# Patient Record
Sex: Male | Born: 1975 | Race: White | Marital: Married | Smoking: Current every day smoker
Health system: Southern US, Community
[De-identification: ages and names within clinical notes are randomized; demographics above are authoritative.]

## PROBLEM LIST (undated history)

## (undated) DIAGNOSIS — K219 Gastro-esophageal reflux disease without esophagitis: Secondary | ICD-10-CM

## (undated) DIAGNOSIS — Z8489 Family history of other specified conditions: Secondary | ICD-10-CM

## (undated) DIAGNOSIS — K2270 Barrett's esophagus without dysplasia: Secondary | ICD-10-CM

## (undated) DIAGNOSIS — K449 Diaphragmatic hernia without obstruction or gangrene: Secondary | ICD-10-CM

## (undated) HISTORY — PX: APPENDECTOMY (OPEN): SHX54

## (undated) HISTORY — DX: Barrett's esophagus without dysplasia: K22.70

## (undated) HISTORY — DX: Gastro-esophageal reflux disease without esophagitis: K21.9

## (undated) HISTORY — DX: Diaphragmatic hernia without obstruction or gangrene: K44.9

---

## 2009-03-15 ENCOUNTER — Emergency Department: Admit: 2009-03-15 | Payer: Self-pay | Source: Emergency Department | Admitting: Emergency Medicine

## 2009-03-15 LAB — COMPREHENSIVE METABOLIC PANEL
ALT: 35 U/L (ref 0–55)
AST (SGOT): 26 U/L (ref 5–34)
Albumin/Globulin Ratio: 1.4 (ref 0.9–2.2)
Albumin: 4.4 g/dL (ref 3.5–5.0)
Alkaline Phosphatase: 66 U/L (ref 40–150)
BUN: 13 mg/dL (ref 9–21)
Bilirubin, Total: 0.9 mg/dL (ref 0.2–1.2)
CO2: 23 mEq/L (ref 22–29)
Calcium: 9.3 mg/dL (ref 8.9–10.0)
Chloride: 104 mEq/L (ref 96–107)
Creatinine: 1 mg/dL (ref 0.7–1.3)
Globulin: 3.1 g/dL (ref 2.0–3.6)
Glucose: 90 mg/dL (ref 70–110)
Potassium: 4.2 mEq/L (ref 3.5–5.1)
Protein, Total: 7.5 g/dL (ref 6.0–8.3)
Sodium: 140 mEq/L (ref 136–145)

## 2009-03-15 LAB — CBC AND DIFFERENTIAL
Basophils Absolute: 0 /mm3 (ref 0.0–0.2)
Basophils: 1 % (ref 0–2)
Eosinophils Absolute: 0.2 /mm3 (ref 0.0–0.7)
Eosinophils: 3 % (ref 0–5)
Granulocytes Absolute: 4.2 /mm3 (ref 1.8–8.1)
Hematocrit: 39.5 % — ABNORMAL LOW (ref 42.0–52.0)
Hgb: 13.7 G/DL (ref 13.0–17.0)
Immature Granulocytes Absolute: 0 CUMM (ref 0.0–0.0)
Immature Granulocytes: 0 % (ref 0–1)
Lymphocytes Absolute: 1.2 /mm3 (ref 0.5–4.4)
Lymphocytes: 18 % (ref 15–41)
MCH: 29.5 PG (ref 28.0–32.0)
MCHC: 34.7 G/DL (ref 32.0–36.0)
MCV: 85.1 FL (ref 80.0–100.0)
MPV: 11.4 FL (ref 9.4–12.3)
Monocytes Absolute: 0.9 /mm3 (ref 0.0–1.2)
Monocytes: 14 % — ABNORMAL HIGH (ref 0–11)
Neutrophils %: 64 % (ref 52–75)
Platelets: 194 /mm3 (ref 140–400)
RBC: 4.64 /mm3 — ABNORMAL LOW (ref 4.70–6.00)
RDW: 13.3 % (ref 11.5–15.0)
WBC: 6.46 /mm3 (ref 3.50–10.80)

## 2009-03-15 LAB — GFR

## 2009-03-15 LAB — CREATINE KINASE W/O REFLEX (SOFT): Creatine Kinase (CK): 89 U/L (ref 30–200)

## 2009-03-15 LAB — HEMOLYSIS INDEX: Hemolysis Index: 24 Units

## 2009-03-15 LAB — D-DIMER (DVT) CERNER: D-Dimer (DVT): <100 ng mL (ref 0–600)

## 2009-03-15 LAB — TROPONIN I: Troponin I: 0.01 ng/mL

## 2009-07-22 HISTORY — PX: APPENDECTOMY: SHX54

## 2009-07-31 ENCOUNTER — Inpatient Hospital Stay
Admission: EM | Admit: 2009-07-31 | Disposition: A | Discharge status: Home / Self Care | Payer: Self-pay | Source: Emergency Department | Admitting: Surgery

## 2009-07-31 LAB — BASIC METABOLIC PANEL
BUN: 6 mg/dL — ABNORMAL LOW (ref 8–20)
CO2: 28 mEq/L (ref 21–30)
Calcium: 10.1 mg/dL (ref 8.6–10.2)
Chloride: 96 mEq/L — ABNORMAL LOW (ref 98–107)
Creatinine: 1 mg/dL (ref 0.6–1.5)
Glucose: 106 mg/dL — ABNORMAL HIGH (ref 70–100)
Potassium: 4 mEq/L (ref 3.6–5.0)
Sodium: 136 mEq/L (ref 136–146)

## 2009-07-31 LAB — HEPATIC FUNCTION PANEL
ALT: 40 U/L — ABNORMAL HIGH (ref 3–36)
AST (SGOT): 38 U/L (ref 10–41)
Albumin/Globulin Ratio: 1.6 (ref 1.1–1.8)
Albumin: 5.1 g/dL — ABNORMAL HIGH (ref 3.4–4.9)
Alkaline Phosphatase: 100 U/L (ref 43–112)
Bilirubin Direct: 0 mg/dL (ref 0.0–0.3)
Bilirubin Indirect: 1.1 mg/dL — ABNORMAL HIGH (ref 0.1–0.9)
Bilirubin, Total: 1.1 mg/dL — ABNORMAL HIGH (ref 0.1–1.0)
Globulin: 3.1 g/dL (ref 2.0–3.7)
Protein, Total: 8.2 g/dL — ABNORMAL HIGH (ref 6.0–8.0)

## 2009-07-31 LAB — CBC AND DIFFERENTIAL
Basophils Absolute: 0 /mm3 (ref 0.0–0.2)
Basophils: 0 % (ref 0–2)
Eosinophils Absolute: 0 /mm3 (ref 0.0–0.7)
Eosinophils: 0 % (ref 0–5)
Granulocytes Absolute: 12.2 /mm3 — ABNORMAL HIGH (ref 1.8–8.1)
Hematocrit: 42.7 % (ref 42.0–52.0)
Hgb: 14.4 G/DL (ref 13.0–17.0)
Immature Granulocytes Absolute: 0
Immature Granulocytes: 0 %
Lymphocytes Absolute: 0.8 /mm3 (ref 0.5–4.4)
Lymphocytes: 5 % — ABNORMAL LOW (ref 15–41)
MCH: 29.1 PG (ref 28.0–32.0)
MCHC: 33.7 G/DL (ref 32.0–36.0)
MCV: 86.3 FL (ref 80.0–100.0)
MPV: 12 FL (ref 9.4–12.3)
Monocytes Absolute: 1.3 /mm3 — ABNORMAL HIGH (ref 0.0–1.2)
Monocytes: 9 % (ref 0–11)
Neutrophils %: 85 % — ABNORMAL HIGH (ref 52–75)
Platelets: 220 /mm3 (ref 140–400)
RBC: 4.95 /mm3 (ref 4.70–6.00)
RDW: 13.6 % (ref 11.5–15.0)
WBC: 14.4 /mm3 — ABNORMAL HIGH (ref 3.50–10.80)

## 2009-07-31 LAB — AMYLASE: Amylase: 57 U/L (ref 5–90)

## 2009-07-31 LAB — GFR

## 2009-07-31 LAB — LIPASE: Lipase: 27 U/L — ABNORMAL LOW (ref 32–219)

## 2009-08-03 ENCOUNTER — Inpatient Hospital Stay
Admission: EM | Admit: 2009-08-03 | Disposition: A | Discharge status: Home / Self Care | Payer: Self-pay | Source: Emergency Department | Admitting: Surgery

## 2009-08-03 LAB — CBC AND DIFFERENTIAL
Basophils Absolute: 0 /mm3 (ref 0.0–0.2)
Basophils: 0 % (ref 0–2)
Eosinophils Absolute: 0 /mm3 (ref 0.0–0.7)
Eosinophils: 0 % (ref 0–5)
Granulocytes Absolute: 14.1 /mm3 — ABNORMAL HIGH (ref 1.8–8.1)
Hematocrit: 39.5 % — ABNORMAL LOW (ref 42.0–52.0)
Hgb: 13.2 G/DL (ref 13.0–17.0)
Immature Granulocytes Absolute: 0
Immature Granulocytes: 0 %
Lymphocytes Absolute: 0.4 /mm3 — ABNORMAL LOW (ref 0.5–4.4)
Lymphocytes: 2 % — ABNORMAL LOW (ref 15–41)
MCH: 28.8 PG (ref 28.0–32.0)
MCHC: 33.4 G/DL (ref 32.0–36.0)
MCV: 86.2 FL (ref 80.0–100.0)
MPV: 11.4 FL (ref 9.4–12.3)
Monocytes Absolute: 1.1 /mm3 (ref 0.0–1.2)
Monocytes: 7 % (ref 0–11)
Neutrophils %: 90 % — ABNORMAL HIGH (ref 52–75)
Platelets: 219 /mm3 (ref 140–400)
RBC: 4.58 /mm3 — ABNORMAL LOW (ref 4.70–6.00)
RDW: 13.6 % (ref 11.5–15.0)
WBC: 15.65 /mm3 — ABNORMAL HIGH (ref 3.50–10.80)

## 2009-08-03 LAB — COMPREHENSIVE METABOLIC PANEL
ALT: 29 U/L (ref 3–36)
AST (SGOT): 23 U/L (ref 10–41)
Albumin/Globulin Ratio: 1.4 (ref 1.1–1.8)
Albumin: 3.8 g/dL (ref 3.4–4.9)
Alkaline Phosphatase: 111 U/L (ref 43–112)
BUN: 8 mg/dL (ref 8–20)
Bilirubin, Total: 0.7 mg/dL (ref 0.1–1.0)
CO2: 30 mEq/L (ref 21–30)
Calcium: 9.6 mg/dL (ref 8.6–10.2)
Chloride: 96 mEq/L — ABNORMAL LOW (ref 98–107)
Creatinine: 1.1 mg/dL (ref 0.6–1.5)
Globulin: 2.7 g/dL (ref 2.0–3.7)
Glucose: 108 mg/dL — ABNORMAL HIGH (ref 70–100)
Potassium: 4.1 mEq/L (ref 3.6–5.0)
Protein, Total: 6.5 g/dL (ref 6.0–8.0)
Sodium: 138 mEq/L (ref 136–146)

## 2009-08-03 LAB — CBC
Hematocrit: 36.6 % — ABNORMAL LOW (ref 42.0–52.0)
Hematocrit: 34.2 % — ABNORMAL LOW (ref 42.0–52.0)
Hgb: 12.3 G/DL — ABNORMAL LOW (ref 13.0–17.0)
Hgb: 11.4 G/DL — ABNORMAL LOW (ref 13.0–17.0)
MCH: 29 PG (ref 28.0–32.0)
MCH: 28.6 PG (ref 28.0–32.0)
MCHC: 33.6 G/DL (ref 32.0–36.0)
MCHC: 33.3 G/DL (ref 32.0–36.0)
MCV: 86.3 FL (ref 80.0–100.0)
MCV: 85.9 FL (ref 80.0–100.0)
MPV: 11.1 FL (ref 9.4–12.3)
MPV: 10.4 FL (ref 9.4–12.3)
Platelets: 223 /mm3 (ref 140–400)
Platelets: 223 /mm3 (ref 140–400)
RBC: 4.24 /mm3 — ABNORMAL LOW (ref 4.70–6.00)
RBC: 3.98 /mm3 — ABNORMAL LOW (ref 4.70–6.00)
RDW: 13.7 % (ref 11.5–15.0)
RDW: 13.9 % (ref 11.5–15.0)
WBC: 15.41 /mm3 — ABNORMAL HIGH (ref 3.50–10.80)
WBC: 11.57 /mm3 — ABNORMAL HIGH (ref 3.50–10.80)

## 2009-08-03 LAB — BASIC METABOLIC PANEL
BUN: 9 mg/dL (ref 8–20)
CO2: 28 mEq/L (ref 21–30)
Calcium: 8.7 mg/dL (ref 8.6–10.2)
Chloride: 98 mEq/L (ref 98–107)
Creatinine: 1 mg/dL (ref 0.6–1.5)
Glucose: 109 mg/dL — ABNORMAL HIGH (ref 70–100)
Potassium: 4 mEq/L (ref 3.6–5.0)
Sodium: 138 mEq/L (ref 136–146)

## 2009-08-03 LAB — GFR

## 2009-08-04 LAB — CBC
Hematocrit: 35.3 % — ABNORMAL LOW (ref 42.0–52.0)
Hgb: 11.7 G/DL — ABNORMAL LOW (ref 13.0–17.0)
MCH: 28.7 PG (ref 28.0–32.0)
MCHC: 33.1 G/DL (ref 32.0–36.0)
MCV: 86.5 FL (ref 80.0–100.0)
MPV: 10.5 FL (ref 9.4–12.3)
Platelets: 237 /mm3 (ref 140–400)
RBC: 4.08 /mm3 — ABNORMAL LOW (ref 4.70–6.00)
RDW: 14 % (ref 11.5–15.0)
WBC: 9.64 /mm3 (ref 3.50–10.80)

## 2009-08-06 LAB — BASIC METABOLIC PANEL
BUN: 14 mg/dL (ref 8–20)
CO2: 22 mEq/L (ref 21–30)
Calcium: 8.5 mg/dL — ABNORMAL LOW (ref 8.6–10.2)
Chloride: 108 mEq/L — ABNORMAL HIGH (ref 98–107)
Creatinine: 0.8 mg/dL (ref 0.6–1.5)
Glucose: 87 mg/dL (ref 70–100)
Potassium: 3.8 mEq/L (ref 3.6–5.0)
Sodium: 141 mEq/L (ref 136–146)

## 2009-08-06 LAB — GFR

## 2009-08-06 LAB — PHOSPHORUS: Phosphorus: 3 mg/dL (ref 2.5–4.5)

## 2009-08-06 LAB — MAGNESIUM: Magnesium: 1.9 mg/dL (ref 1.6–2.3)

## 2010-08-18 ENCOUNTER — Emergency Department: Admit: 2010-08-18 | Payer: Self-pay | Source: Emergency Department

## 2011-09-13 LAB — ECG 12-LEAD
Atrial Rate: 79 {beats}/min
P Axis: 42 degrees
P-R Interval: 130 ms
Q-T Interval: 350 ms
QRS Duration: 92 ms
QTC Calculation (Bezet): 401 ms
R Axis: 44 degrees
T Axis: 40 degrees
Ventricular Rate: 79 {beats}/min

## 2011-10-10 NOTE — H&P (Signed)
Account Number: [id number]      Document ID: [id number]      Admit Date: 08/03/2009            Patient Location: [id number]      Patient Type: I            ATTENDING PHYSICIAN: Einar Crow, MD                  HISTORY OF PRESENT ILLNESS:      The patient is a 35 year old patient who was discharged from the hospital      on August 02, 2009, after undergoing laparoscopic appendectomy for a      perforated appendicitis with a small focal abscess.  His hospital course      took place over 2 days, during which he was hospitalized, received      intravenous fluids and intravenous antibiotics, and was subsequently      discharged once his pain was under control, and was tolerating a diet.      Shortly after having lunch on August 02, 2009, the patient experienced      episodes of vomiting that persisted later on through the evening.  That      brought him to the emergency room.  His main complaints were epigastric      burning pain, nausea and vomiting.  He had one watery bowel movement      yesterday.  Given the persistence of his symptoms and the fact that he felt      tired, he sought help in the emergency room.            PAST MEDICAL HISTORY:      Negative.            SOCIAL HISTORY:      He admits to occasional use of tobacco and alcohol.            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  He smokes between 1 and 5 cigarettes every weekend.            REVIEW OF SYSTEMS:      Negative for NEUROLOGIC, SKIN, CARDIOVASCULAR, PULMONARY, GU,      MUSCULOSKELETAL complaints.      GASTROINTESTINAL:  Symptoms are as noted with this and the previous      hospitalization.            PHYSICAL EXAMINATION:      GENERAL:  The patient is awake, alert and oriented, and cooperative.      VITAL SIGNS:  His blood pressure is 140/80, his pulse is 91.  His      temperature is 97.7.  His respiratory rate is 16.      GENERAL CONDITION: Overall, the patient is tired and in mild to  moderate      pain,  stating at about 2/10.      SKIN:  Dry and warm.      HEAD AND NECK:  Normocephalic, atraumatic.  There is no evidence of scleral      icterus.  There are normal extraocular movements.  There is no cervical or      supraclavicular adenopathy.  There is no thyromegaly.      CHEST:  Clear to auscultation.      HEART:  Regular rate and rhythm.      ABDOMEN:  Mildly to moderately  distended, tympanitic on percussion.  There      are diminished bowel sounds upon auscultation.  He has some tenderness in      the right lower quadrant and periumbilical regions.      EXTREMITIES:  There are no deformities.  He is able to move all      extremities.      GENITOURINARY:  Testes are descended.  There is no evidence of inguinal      hernias.            LABORATORY DATA:      An abdominal plain film demonstrates the presence of small bowel air-fluid      levels with some gas in the right colon and transverse colon.            ASSESSMENT:      The patient is a 35 year old patient who underwent laparoscopic      appendectomy on July 31, 2009.  This was characterized by the presence of      intraabdominal localized abscess.  The patient's recovery was satisfactory,      over the course of the following 2 days, being discharged from the      hospital.  His date of discharge was August 02, 2009.  Today, on August 13,      he is being readmitted with complaints of nausea and vomiting and some      abdominal distention.  Differential diagnosis at this point includes an      adynamic paralytic ileus versus small-bowel obstruction.  Another      possibility is ongoing intraabdominal infection.  At this point, I have      recommended for the patient to be hospitalized, to be kept n.p.o. and      receive intravenous hydration.  We will continue intravenous antibiotic      treatment at this point, which he was receiving for his perforated      appendix.  At this point, we will continue to monitor his clinical course.       Given the episodes of vomiting, I would recommend to place an NG tube.            If the patient's clinical course improves, then we will follow along.  If      on the other hand, there is no improvement or deterioration over the course      of the next 24 to 48 hours, he may need a computerized tomography of the      abdomen to define if there is an intraabdominal abscess or if there is an      actual mechanical point of obstruction.                        Electronic Signing Provider      _______________________________     Date/Time Signed: _____________      Juleen China MD ([id number])            D:  08/03/2009 14:24 PM by Dr. Juleen China, MD ([id number])      T:  08/03/2009 15:13 PM by Garth Bigness          (Conf: [id number]) (Doc ID: [id number])                  KV:[id number] Genia Hotter MD

## 2011-10-10 NOTE — Consults (Signed)
Account Number: [id number]      Document ID: [id number]      Admit Date: 07/31/2009      Service Date: 07/31/2009            Patient Location: [id number]      Patient Type: I            CONSULTING PHYSICIAN: Juleen China MD            REFERRING PHYSICIAN: Kem Kays, MD            CHIEF COMPLAINT:      Abdominal pain, rule out appendicitis.            HISTORY OF PRESENT ILLNESS:      The patient is 35 year old male patient who presented with an approximately      18 to 20-hour history of abdominal pain and characterized by a pain which      started  epigastric and periumbilical area and over the course of the last      18 hours has localized to the right lower quadrant.  The pain is associated      with nausea and vomiting.  He has been initially of a low intensity, but      subsequently has increased.  It is constant and at this point, it is      significantly localized in the right lower quadrant.  The pain does not      radiate to any particular location.  The pain is worsened by activities and      ambulation.  It improves slightly with rest and raising the legs.  The      patient denies any diarrhea and any prior episodes of illness similar to      this one.            PAST MEDICAL HISTORY:      Negative.            SOCIAL HISTORY:      He admits an occasional use of alcohol and tobacco.            FAMILY HISTORY:      Noncontributory.            REVIEW OF SYSTEMS:      Negative for neurologic, skin, cardiovascular, pulmonary, GI, GU,      musculoskeletal prior to this acute episode of illness.  GI symptoms are as      noted with this hospitalization.            PHYSICAL EXAMINATION:      GENERAL:  The patient is awake, alert, oriented, in acute distress,      complaining of abdominal pain when moving.      VITAL SIGNS:  His heart rate is 100 per minute.  His respiratory rate is 18      per minute, his blood pressure is 130/65.  He is awake, alert, oriented and      cooperative.      NEUROLOGIC:  Normal with  patient who is awake, alert, oriented to place,      time circumstance.  He does not have any gross deficits on cranial nerves      from II to XII      EXTREMITIES:  He can move all 4 extremities without any deficit.  He has      preservation of his coordination as well and ambulation only impaired by      the pain.  HEAD:  There is no scleral icterus.  Ocular movements are preserved.      NECK:  There is no evidence of cervical or supraclavicular adenopathy.      There is no thyromegaly.      CHEST/RESPIRATORY:  Clear to auscultation.  No crepitus or rales.      HEART:  Regular rate and rhythm. There are no murmurs or gallops.      ABDOMEN:  Mildly distended.  Tender to palpation diffusely but      significantly more in the right lower quadrant.  There is positive      McBurney's sign and positive Rovsing's sign.  He also has a positive      obturator sign.      GENITOURINARY:  Normal.  There is both descended testicles, there is no      evidence of inguinal hernias.            LABORATORY DATA:      Significant for a leukocytosis with a white blood cell count of 14,000.      Otherwise, his laboratory evaluation is unremarkable.            ASSESSMENT:      The patient is a 35 year old patient with signs and symptoms consistent      with acute appendicitis.  At this point, I have extensively discussed with      the patient and his fiancee the diagnosis and the implications of      appendicitis and the risk of perforation.  I have also discussed the      alternatives of therapy.  I have strongly recommended for him to undergo an      appendectomy in order to control the disease.  We have discussed the      possibility of open appendectomy and we have extensively reviewed the      potential benefits and risks of this approach.  He has expressed      understanding.  At this point, is requesting to proceed with laparoscopic      appendectomy.                        Electronic Signing Provider       _______________________________     Date/Time Signed: _____________      Juleen China MD ([id number])            D:  08/01/2009 02:13 AM by Dr. Juleen China, MD ([id number])      T:  08/01/2009 07:36 AM by BP          (Conf: [id number]) (Doc ID: [id number])                  FA:OZHYQM Genia Hotter MD

## 2011-10-10 NOTE — Op Note (Signed)
Account Number: [id number]      Document ID: [id number]      Admit Date: 07/31/2009      Procedure Date: 07/31/2009            Patient Location: [id number]      Patient Type: I            SURGEON: Juleen China MD      ASSISTANT: Dr. Lajuana Matte            PREOPERATIVE DIAGNOSIS:      Acute appendicitis.            POSTOPERATIVE DIAGNOSIS:      Perforated appendicitis with a periappendiceal abscess.            INDICATIONS FOR SURGERY:      The patient is a 35 -year-old male patient who presented with a less than      24 hour history of acute abdominal pain, periumbilical, which localized to      the right lower quadrant.  On physical exam he was found to have a      tenderness in the right lower quadrant and a positive McBurney sign with a      focal peritoneal findings.  He also had leukocytosis.  He was given a      diagnosis of appendicitis and recommended for appendectomy.  Risks and      benefits were discussed and the laparoscopic approach was recommended.  The      patient was agreeable to this plan.            DESCRIPTION OF PROCEDURE:      The patient was brought into the operating room and placed supine on the      operating table where general anesthesia was induced and endotracheal      intubation performed.  Then a Foley catheter was placed and the skin of the      abdomen was prepped with Betadine and draped in the usual sterile fashion.      At this point a time out was taken where the patient's identity, type of      procedure and presence of required equipment was verified.  Administration      of intravenous antibiotics was documented as well as measures of      prophylaxis of DVT with SCDs.            The procedure was started by injecting a 0.25% Marcaine in the subumbilical      area where a transverse incision was made with a 15 scalpel blade.      Dissection was carried through the subcutaneous tissue down to the fascia.      The fascia was grasped with two penetrating Towel clips and a longitudinal       incision was made.  Then the peritoneal cavity was entered.  Upon entering      the peritoneal cavity, Hasson trocar was placed and the subjacent bowel was      examined and no evidence of any injury was noted.  Then we secured the      Hasson trocar to the fascia with two interrupted 0 Vicryl sutures.  Then      examination of the peritoneal cavity immediately revealed inflammatory      changes in the right lower quadrant.  Two other 5 mm trocars were placed,      one in the suprapubic location and a second one in the left  lower quadrant.       Then, upon mobilization of the fold of Treves a small pocket of purulent      fluid was encountered.  This pus was aspirated for culture.  Then upon      mobilization of the area of cecum that was adherent to this site, we      identified the appendix which was perforated in the proximal third.  In      this area a fecalith appeared to be present.  We then grasped the appendix      and raised it and immediately a window was developed at the base of the      appendix.  An endo GIA 2.5 mm stapler was introduced through this window      and the appendix was stapled off the cecum.  Then a second fire of the endo      GIA was used to divide the mesentery of the appendix.  Then the appendix      was placed into a bag.  The two other 5 mm ports were removed under direct      vision and then the appendix within a bag was removed through the      subumbilical port.  The fascia was closed with three interrupted 0 Vicryl      sutures.  Then the subcutaneous tissue was extensively irrigated with      saline solution and finally the skin was closed with running 4-0 Monocryl      sutures.  The patient tolerated the procedure very well.  The instrument      counts were correct.                        Electronic Signing Provider      _______________________________     Date/Time Signed: _____________      Juleen China MD ([id number])            D:  07/31/2009 22:38 PM by Dr. Juleen China, MD ([id number])      T:  08/01/2009 10:19 AM by Ottis Stain          (Conf: [phone]) (Doc ID: [id number])                  FA:OZHYQM Genia Hotter MD

## 2011-10-10 NOTE — Discharge Summary (Signed)
Account Number: [id number]      Document ID: [id number]      Admit Date: 08/03/2009      Discharge Date: 08/07/2009            ATTENDING PHYSICIAN:  Einar Crow, MD                  HISTORY OF PRESENT ILLNESS:      The patient is a 35 year old gentleman who was discharged from this      hospital on August 02, 2009, after undergoing a laparoscopic appendectomy      for a perforated appendicitis with a small focal abscess.  His hospital      course took place over 2 days during which he was hospitalized, received      intravenous fluids and intravenous antibiotics, and was subsequently      discharged once his pain was under control and was tolerating a diet.            Shortly after having lunch on August 02, 2009, the patient experienced      episodes of vomiting that persisted later through the evening.  That      brought him to the emergency room.  His main complaints were epigastric      burning pain, nausea, and vomiting.  He had 1 watery bowel movement      yesterday.  Given the persistence of his symptoms, and the fact that he      felt tired, he sought help in the emergency room.            PERTINENT PHYSICAL FINDINGS:      Abdomen mildly to moderately distended, tympanitic on percussion.  There      are diminished bowel sounds upon auscultation.  He has some tenderness in      the right lower quadrant and periumbilical region.            HOSPITAL COURSE:      A plain abdominal film demonstrated the presence of small bowel air-fluid      levels with some gas in the right colon and transverse colon.  The initial      white blood count was 15,600.  An NG tube was inserted, as the radiologic      diagnosis was that of a bowel obstruction.  Initial output was almost 300      mL.            Later in the day on August 03, 2009, a repeat white blood count had      decreased to 11,500.            On August 04, 2009, the patient's white blood count had decreased to 9,600,      within normal limits.            By August 05, 2008, the patient had had at least 3 bowel movements, and      over all was feeling somewhat improved.  Repeat abdominal x-rays on August 05, 2009 showed a persistent air and fluid distention of the small bowel,      with air-fluid levels.            On August 06, 2009, the nasogastric tube was removed, and the patient was      started on sips of clear liquids.  He was as well continuing to have liquid      bowel movements.  By August 07, 2009, the patient was tolerating a full liquid diet without      nausea and vomiting, and was again having loose bowel movements.            Importantly, on admission, on August 03, 2009, the patient was started on      ciprofloxacin 400 mg IV q.24 h., which was later in the day changed to 400      mg IV q.12 h. Additionally, the patient was started on Flagyl on admission      500 mg q.8 h.  Both of these medications were continued throughout the      patient's hospital course.  The patient will not be discharged, therefore,      on antibiotics.            DISPOSITION:      Therefore, on August 07, 2009, the patient was discharged home in an      improved condition.  As mentioned above, he was eating satisfactorily,      having adequate bowel activity, and ambulating with minimal discomfort.      Additionally, all of his incisions from the laparoscopic appendectomy were      healing satisfactorily, without infection.            FOLLOWUP:      The patient will be seen in followup by Dr. Genia Hotter, general surgery, within      7 to 10 days.                        Electronic Signing Provider      _______________________________     Date/Time Signed: _____________      Latanya Presser MD [phone])            D:  08/07/2009 16:07 PM by Dr. Lorelle Gibbs. Charlestine Massed, MD [phone])      T:  08/08/2009 09:33 AM by [id number]          Everlean Cherry: [id number]) (Doc ID: [id number])                        YS:AYTK Joseph Berkshire MD

## 2012-01-18 ENCOUNTER — Emergency Department
Admission: EM | Admit: 2012-01-18 | Discharge: 2012-01-18 | Disposition: A | Discharge status: Home / Self Care | Payer: Commercial Managed Care - POS | Attending: Emergency Medicine | Admitting: Emergency Medicine

## 2012-01-18 ENCOUNTER — Emergency Department: Payer: Commercial Managed Care - POS

## 2012-01-18 DIAGNOSIS — R112 Nausea with vomiting, unspecified: Secondary | ICD-10-CM | POA: Insufficient documentation

## 2012-01-18 DIAGNOSIS — R197 Diarrhea, unspecified: Secondary | ICD-10-CM | POA: Insufficient documentation

## 2012-01-18 DIAGNOSIS — F17200 Nicotine dependence, unspecified, uncomplicated: Secondary | ICD-10-CM | POA: Insufficient documentation

## 2012-01-18 LAB — URINALYSIS WITH MICROSCOPIC
Bilirubin, UA: NEGATIVE
Blood, UA: NEGATIVE
Glucose, UA: NEGATIVE
Ketones UA: NEGATIVE
Leukocyte Esterase, UA: NEGATIVE
Nitrite, UA: NEGATIVE
Specific Gravity UA POCT: 1.022 (ref 1.001–1.035)
Urine pH: 6.5 (ref 5.0–8.0)
Urobilinogen, UA: NORMAL mg/dL

## 2012-01-18 LAB — BASIC METABOLIC PANEL
BUN: 15 mg/dL (ref 8–20)
CO2: 25 mEq/L (ref 21–30)
Calcium: 9.2 mg/dL (ref 8.6–10.2)
Chloride: 100 mEq/L (ref 98–107)
Creatinine: 1 mg/dL (ref 0.6–1.5)
Glucose: 99 mg/dL (ref 70–100)
Potassium: 3.9 mEq/L (ref 3.6–5.0)
Sodium: 138 mEq/L (ref 136–146)

## 2012-01-18 LAB — CBC AND DIFFERENTIAL
Basophils Absolute Automated: 0.01 10*3/uL (ref 0.00–0.20)
Basophils Automated: 0 % (ref 0–2)
Eosinophils Absolute Automated: 0.01 10*3/uL (ref 0.00–0.70)
Eosinophils Automated: 0 % (ref 0–5)
Hematocrit: 42.3 % (ref 42.0–52.0)
Hgb: 14.2 g/dL (ref 13.0–17.0)
Immature Granulocytes Absolute: 0.02 10*3/uL
Immature Granulocytes: 0 % (ref 0–1)
Lymphocytes Absolute Automated: 0.16 10*3/uL — ABNORMAL LOW (ref 0.50–4.40)
Lymphocytes Automated: 2 % — ABNORMAL LOW (ref 15–41)
MCH: 29.8 pg (ref 28.0–32.0)
MCHC: 33.6 g/dL (ref 32.0–36.0)
MCV: 88.7 fL (ref 80.0–100.0)
MPV: 11.7 fL (ref 9.4–12.3)
Monocytes Absolute Automated: 0.31 10*3/uL (ref 0.00–1.20)
Monocytes: 4 % (ref 0–11)
Neutrophils Absolute: 7.93 10*3/uL (ref 1.80–8.10)
Neutrophils: 94 % — ABNORMAL HIGH (ref 52–75)
Nucleated RBC: 0 /100 WBC
Platelets: 172 10*3/uL (ref 140–400)
RBC: 4.77 10*6/uL (ref 4.70–6.00)
RDW: 13 % (ref 12–15)
WBC: 8.44 10*3/uL (ref 3.50–10.80)

## 2012-01-18 LAB — GFR: EGFR: >60

## 2012-01-18 MED ORDER — ONDANSETRON 4 MG PO TBDP
4.00 mg | ORAL_TABLET | Freq: Four times a day (QID) | ORAL | Status: AC | PRN
Start: 2012-01-18 — End: 2012-01-25

## 2012-01-18 MED ORDER — ONDANSETRON HCL 4 MG/2ML IJ SOLN
4.00 mg | Freq: Once | INTRAMUSCULAR | Status: AC
Start: 2012-01-18 — End: 2012-01-18
  Administered 2012-01-18: 4 mg via INTRAVENOUS
  Filled 2012-01-18: qty 2

## 2012-01-18 MED ORDER — SODIUM CHLORIDE 0.9 % IV BOLUS
1000.00 mL | Freq: Once | INTRAVENOUS | Status: AC
Start: 2012-01-18 — End: 2012-01-18
  Administered 2012-01-18: 1000 mL via INTRAVENOUS

## 2012-01-18 MED ORDER — FAMOTIDINE 10 MG/ML IV SOLN
20.00 mg | Freq: Once | INTRAVENOUS | Status: AC
Start: 2012-01-18 — End: 2012-01-18
  Administered 2012-01-18: 20 mg via INTRAVENOUS
  Filled 2012-01-18: qty 2

## 2012-01-18 NOTE — ED Provider Notes (Signed)
I have reviewed and agree with history, notes, assessments and/or procedures performed. The pertinent physical exam has been documented.  The patient was seen and examined by PA/NP or resident; plan of care was discussed with me.  I agree with the plan as it was presented to me.    I have seen and evaluated this patient    Patient comes in with n/v/d that started last night.  Unsure if it has to do with eating crab last night.  No focal abdominal pain.  No blood in emesis or diarrhea.  No fevers.  Otw healthy.  Has prior h/o appendectomy    Exam:  Gen: Awake, alert, NAD  CV: RRR, no m/r/g  Pulm: CTAB  Abd: soft, minor pain in epigastrum, ND    Plan: labs, hydrate, and po challenge, reeval      Tyson Dense, MD  01/18/12 1517

## 2012-01-18 NOTE — ED Provider Notes (Signed)
Triage MD: pt with hx appy 2 years ago, now presents with diarrhea followed by vomiting, +nausea; will order basic labs, IV fluids, Zofran    Debbora Dus, MD  01/18/12 1353

## 2012-01-18 NOTE — Discharge Instructions (Signed)
CLEAR LIQUID DIET  Clear liquids are any liquid that you can see through as well as those that are very easy to digest. This is used while the body is recovering from irritation or infection of the stomach or intestinal tract. It may also be used before special procedures or surgery. This diet is to be used no more than 3 days. You may include the following items.  ADULTS  Adults should drink a total of 2-3 quarts of liquid per day. It may be easier to drink small frequent servings rather than a few large ones.  FRUIT JUICES: Strained orange juice or lemonade (no pulp), apple, grape and cranberry juice, clear fruit drinks, sports drinks  BEVERAGES: Sport drinks such as Gatorade, sodas, mineral water (plain or flavored), tea, black coffee, liquid gelatin (add twice the recommended amount of water)  SOUPS: Clear broth, consomm, bouillon  DESSERTS: Plain gelatin (Jell-O), popsicles, fruit juice bars  CHILDREN OVER 63 YEARS OLD  FRUIT JUICES: Strained orange juice or lemonade (no pulp), apple, grape and cranberry juice, clear fruit drinks  BEVERAGES: Sports drinks such as Gatorade, sodas, mineral water (plain or flavored), tea, liquid gelatin (add twice the recommended amount of water)  SOUPS: Clear broth, consomm, bouillon  DESSERTS: Plain gelatin (Jell-O), popsicles, fruit juice bars  CHILDREN UNDER 100 YEARS OLD  Oral Rehydration Fluids such as Pedialyte, Infalyte, Rehydralyte. This is available at drug stores and most grocery stores without a prescription.   [address], [address], [place], [place] [address]. All rights reserved. This information is not intended as a substitute for professional medical care. Always follow your healthcare professional's instructions.    Food Poisoning    You have been diagnosed with food poisoning.    Food poisoning is from eating contaminated food or drink. It may be contaminated by bacteria or the toxins (poisons) made by the bacteria. It can also be  contaminated by viruses or parasites. Contamination may also be from chemicals like pesticides on fruits or vegetables. Some foods can be poisonous to humans: Certain mushrooms can cause serious problems. These include the "Death cap" (Amanita phalloides). They also include "false morels" (Gyromitra). Some exotic foods can be dangerous if not stored or prepared right. Symptoms of food poisoning often include nausea, vomiting, diarrhea and crampy abdominal pain. They sometimes include fever (temperature greater than 100.30F or 38C). Food poisoning symptoms often start shortly after having the contaminated food or drink. They can start as soon as 30 minutes or in a few hours. Most cases of food poisoning are not serious. The illness often runs its course in 24-48 hours.     It is important to keep yourself hydrated. You should generally avoid soda pop or drinks with caffeine as this can lead to dehydration. You should also avoid drinks that contain a lot of sugar, like apple or pear juice, as this can make your diarrhea worse. You should drink fluids like Gatorade or Pedialyte.    Food poisoning is treated with medicine to help nausea and diarrhea. Some patients need antibiotics. It might help to avoid eating for the next day. Instead, drink clear liquids, like water, broth, or juice that has been diluted (watered down). Avoid large heavy meals that may make you sick to your stomach. When you start feeling better, you can eat more. You will probably feel better in a few days, but it might take up to a week.    Talk to your regular doctor if you do not  feel better or you have new symptoms or concerns. Otherwise, you do not need to follow up with a doctor.    YOU SHOULD SEEK MEDICAL ATTENTION IMMEDIATELY FOR YOURSELF, EITHER HERE OR AT THE NEAREST EMERGENCY DEPARTMENT, IF ANY OF THE FOLLOWING OCCURS:   You show signs of dehydration. Symptoms of dehydration include a dry or sticky mouth and not urinating for six  hours or more.    You cannot keep clear liquids down or you have vomit that is dark green or bloody.   You feel weak or dizzy at any time.   You have bloody diarrhea.   Your symptoms get worse at any time.   You have stomach pain that gets worse or does not improve after 24 hours.   You notice a rash.   The diarrhea lasts for more than a week after starting treatment.   You do not feel better after treatment.

## 2012-01-18 NOTE — ED Notes (Signed)
Ambulatory to triage c/o n/v/d x 13 hours.  Pt reports eating [employer] for dinner last night, reports n/v/d starting about 5 hours later.

## 2012-01-18 NOTE — ED Provider Notes (Signed)
History   Pt is 36 y/o M to ED c/o N/V/D. Pt states the sx began 5 hours after a large meal at [employer]. Began with N and diarrhea, progressed to vomiting. Vomiting stopped at 11am, diarrhea has continued. Pt denies abdominal pain but has some mild diffuse cramping. Pt has hx of appendectomy. No other medical problems or medications. +chills, denies fever and other complaints.    History reviewed. No pertinent past medical history.  History reviewed. No pertinent family history.  Current Facility-Administered Medications   Medication Dose Route Frequency Provider Last Rate Last Dose   . ondansetron (ZOFRAN) injection 4 mg  4 mg Intravenous Once Debbora Dus, MD   4 mg at 01/18/12 1435   . sodium chloride 0.9 % bolus 1,000 mL  1,000 mL Intravenous Once Debbora Dus, MD   1,000 mL at 01/18/12 1429     Current Outpatient Prescriptions   Medication Sig Dispense Refill   . bismuth subsalicylate (PEPTO BISMOL) 262 MG chewable tablet Chew 524 mg by mouth 4 times daily - with meals and at bedtime.           No Known Allergies  History   Substance Use Topics   . Smoking status: Current Everyday Smoker   . Smokeless tobacco: Not on file   . Alcohol Use: Yes     Results     Procedure Component Value Units Date/Time    UA with Micro [[id number]]  (Abnormal) Collected:01/18/12 1416    Specimen Information:Urine Updated:01/18/12 1456     Urine Type Clean Catch      Color, UA Yellow      Clarity, UA Clear      Specific Gravity, UR 1.022      Urine pH 6.5      Leukocytes, UA Negative      Nitrite, UA Negative      Protein, UA Trace (A)      Glucose, UA Negative      Ketones UA Negative      Urobilinogen, UA Normal mg/dL      Bilirubin, UA Negative      Blood, UA Negative      RBC, UA 0 - 5 /HPF      WBC, UA 0 - 5 /HPF     Basic Metabolic Panel (BMP) [[id number]] Collected:01/18/12 1416    Specimen Information:Blood Updated:01/18/12 1449     Glucose 99 mg/dL      BUN 15 mg/dL      Creatinine 1.0 mg/dL      Calcium 9.2 mg/dL       Sodium 782 mEq/L      Potassium 3.9 mEq/L      Chloride 100 mEq/L      CO2 25 mEq/L     GFR [[id number]] Collected:01/18/12 1416     EGFR >60.0   Updated:01/18/12 1449    CBC with Differential [[id number]]  (Abnormal) Collected:01/18/12 1416    Specimen Information:Blood Updated:01/18/12 1439     WBC 8.44 x10 3/uL      RBC 4.77 x10 6/uL      Hgb 14.2 g/dL      Hematocrit 52.8 %      MCV 88.7 fL      MCH 29.8 pg      MCHC 33.6 g/dL      RDW 13 %      Platelets 172 x10 3/uL      MPV 11.7 fL      Neutrophils 94 (H) %  Lymphocytes Automated 2 (L) %      Monocytes 4 %      Eosinophils Automated 0 %      Basophils Automated 0 %      Immature Granulocyte 0 %      Nucleated RBC 0 /100 WBC      Neutrophils Absolute 7.93 x10 3/uL      Abs Lymph Automated 0.16 (L) x10 3/uL      Abs Mono Automated 0.31 x10 3/uL      Abs Eos Automated 0.01 x10 3/uL      Absolute Baso Automated 0.01 x10 3/uL      Absolute Immature Granulocyte 0.02 x10 3/uL           Review of Systems   Constitutional: Positive for chills. Negative for fever.   HENT: Negative for neck pain and neck stiffness.    Eyes: Negative for pain.   Respiratory: Negative for cough and shortness of breath.    Cardiovascular: Negative for chest pain.   Gastrointestinal: Positive for nausea, vomiting and diarrhea. Negative for abdominal pain.   Genitourinary: Negative for dysuria, urgency and frequency.   Musculoskeletal: Negative for back pain.   Skin: Negative for rash and wound.   Neurological: Positive for weakness. Negative for dizziness, light-headedness and headaches.   [all other systems reviewed and are negative        Physical Exam   BP 123/78  Pulse 91  Temp(Src) 98.8 F (37.1 C) (Oral)  Resp 16  Ht 1.829 m  Wt 86.183 kg  BMI 25.76 kg/m2  SpO2 99%    Physical Exam   [nursing notereviewed.  Constitutional: He is oriented to person, place, and time. He appears well-developed and well-nourished. No distress.   HENT:   Head: Normocephalic and atraumatic.    Eyes: Conjunctivae and EOM are normal. Pupils are equal, round, and reactive to light.   Neck: Normal range of motion. Neck supple.   Cardiovascular: Normal rate, regular rhythm, normal heart sounds and intact distal pulses.    Pulmonary/Chest: Effort normal and breath sounds normal. No respiratory distress.   Abdominal: Soft. Bowel sounds are normal. He exhibits no distension. There is no tenderness. There is no rebound and no guarding.   Lymphadenopathy:     He has no cervical adenopathy.   Neurological: He is alert and oriented to person, place, and time.   Skin: Skin is warm and dry. He is not diaphoretic.       ED Course   Procedures    MDM  Number of Diagnoses or Management Options  Diagnosis management comments: Pt with N/V/D 5 hours after eating at [facility]. No abd pain, no fever. Will check labs, hydrate, zofran.    Labs WNL. Pt sx improved. Will discharge home, BRAT diet, PCP f/u.       Amount and/or Complexity of Data Reviewed  Clinical lab tests: ordered and reviewed      Physician/Midlevel provider first contact with patient: 1406 (01/18/12 1406 : Mallisa Alameda MICHELLE)         3:56 PM  Recheck: pt feeling better. No further N/V/D. No abd pain. Fluids still going. Will PO challenge prior to discharge.    5:03 PM  Pt passed PO challenge: graham crackers and ginger ale. Still feeling well, no N/V/D. Ready to go home.     Discharge: diagnosis and treatment plan discussed with patient who expressed understanding.  Pt to go home with BRAT diet. Clear liquids. Return to  ED for worsening of sx. F/u with PCP if not better tomorrow.    Honor Junes, [place]  01/18/12 1705

## 2016-06-02 DIAGNOSIS — K219 Gastro-esophageal reflux disease without esophagitis: Secondary | ICD-10-CM | POA: Insufficient documentation

## 2016-06-02 DIAGNOSIS — M545 Low back pain: Secondary | ICD-10-CM | POA: Insufficient documentation

## 2016-06-02 DIAGNOSIS — W108XXA Fall (on) (from) other stairs and steps, initial encounter: Secondary | ICD-10-CM | POA: Insufficient documentation

## 2016-06-02 DIAGNOSIS — Y9301 Activity, walking, marching and hiking: Secondary | ICD-10-CM | POA: Insufficient documentation

## 2016-06-02 DIAGNOSIS — F17200 Nicotine dependence, unspecified, uncomplicated: Secondary | ICD-10-CM | POA: Insufficient documentation

## 2016-06-03 ENCOUNTER — Emergency Department
Admission: EM | Admit: 2016-06-03 | Discharge: 2016-06-03 | Disposition: A | Discharge status: Home / Self Care | Payer: Commercial Managed Care - POS | Attending: Emergency Medicine | Admitting: Emergency Medicine

## 2016-06-03 ENCOUNTER — Emergency Department: Payer: Commercial Managed Care - POS

## 2016-06-03 DIAGNOSIS — M549 Dorsalgia, unspecified: Secondary | ICD-10-CM

## 2016-06-03 HISTORY — DX: Gastro-esophageal reflux disease without esophagitis: K21.9

## 2016-06-03 LAB — I-STAT CHEM 8 CARTRIDGE
BUN I-Stat: 13 mg/dL (ref 8–20)
BUN I-Stat: 13 mg/dL (ref 8–20)
Chloride I-Stat: 101 mEq/L (ref 98–107)
Chloride I-Stat: 102 mEq/L (ref 98–107)
Creatinine I-Stat: 1 mg/dL (ref 0.6–1.5)
Creatinine I-Stat: 1.1 mg/dL (ref 0.6–1.5)
Glucose I-Stat: 111 mg/dL — ABNORMAL HIGH (ref 70–100)
Glucose I-Stat: 81 mg/dL (ref 70–100)
Hematocrit I-Stat: 39 % — ABNORMAL LOW (ref 42.0–52.0)
Hematocrit I-Stat: 36 % — ABNORMAL LOW (ref 42.0–52.0)
Hemoglobin I-Stat: 13.3 g/dL (ref 13.0–17.0)
Hemoglobin I-Stat: 12.2 g/dL — ABNORMAL LOW (ref 13.0–17.0)
Potassium I-Stat: 3.7 mEq/L (ref 3.5–5.3)
Potassium I-Stat: 3.7 mEq/L (ref 3.5–5.3)
Sodium I-Stat: 142 mEq/L (ref 136–146)
Sodium I-Stat: 144 mEq/L (ref 136–146)
i-STAT CO2: 28 mEq/L (ref 21–30)
i-STAT CO2: 27 mEq/L (ref 21–30)
i-STAT Calcium Ionized: 2.5 mEq/L (ref 2.30–2.58)
i-STAT Calcium Ionized: 2.4 mEq/L (ref 2.30–2.58)

## 2016-06-03 LAB — CBC AND DIFFERENTIAL
Absolute NRBC: 0 10*3/uL
Basophils Absolute Automated: 0.03 10*3/uL (ref 0.00–0.20)
Basophils Automated: 0.4 %
Eosinophils Absolute Automated: 0.03 10*3/uL (ref 0.00–0.70)
Eosinophils Automated: 0.4 %
Hematocrit: 35.8 % — ABNORMAL LOW (ref 42.0–52.0)
Hgb: 12.3 g/dL — ABNORMAL LOW (ref 13.0–17.0)
Immature Granulocytes Absolute: 0.03 10*3/uL
Immature Granulocytes: 0.4 %
Lymphocytes Absolute Automated: 0.3 10*3/uL — ABNORMAL LOW (ref 0.50–4.40)
Lymphocytes Automated: 3.9 %
MCH: 29.4 pg (ref 28.0–32.0)
MCHC: 34.4 g/dL (ref 32.0–36.0)
MCV: 85.6 fL (ref 80.0–100.0)
MPV: 11.7 fL (ref 9.4–12.3)
Monocytes Absolute Automated: 0.59 10*3/uL (ref 0.00–1.20)
Monocytes: 7.7 %
Neutrophils Absolute: 6.72 10*3/uL (ref 1.80–8.10)
Neutrophils: 87.2 %
Nucleated RBC: 0 /100 WBC (ref 0.0–1.0)
Platelets: 169 10*3/uL (ref 140–400)
RBC: 4.18 10*6/uL — ABNORMAL LOW (ref 4.70–6.00)
RDW: 14 % (ref 12–15)
WBC: 7.7 10*3/uL (ref 3.50–10.80)

## 2016-06-03 LAB — URINALYSIS, REFLEX TO MICROSCOPIC EXAM IF INDICATED
Bilirubin, UA: NEGATIVE
Blood, UA: NEGATIVE
Glucose, UA: NEGATIVE
Ketones UA: 5 — AB
Leukocyte Esterase, UA: NEGATIVE
Nitrite, UA: NEGATIVE
Protein, UR: 30 — AB
Specific Gravity UA: 1.026 (ref 1.001–1.035)
Urine pH: 5 (ref 5.0–8.0)
Urobilinogen, UA: NORMAL mg/dL

## 2016-06-03 LAB — PT AND APTT
PT INR: 1 (ref 0.9–1.1)
PT: 13.1 s (ref 12.6–15.0)
PTT: 24 s (ref 23–37)

## 2016-06-03 MED ORDER — CYCLOBENZAPRINE HCL 10 MG PO TABS
10.0000 mg | ORAL_TABLET | Freq: Three times a day (TID) | ORAL | Status: AC | PRN
Start: 2016-06-03 — End: 2016-06-18

## 2016-06-03 MED ORDER — OXYCODONE-ACETAMINOPHEN 5-325 MG PO TABS
ORAL_TABLET | ORAL | Status: DC
Start: 2016-06-03 — End: 2018-04-08

## 2016-06-03 MED ORDER — MORPHINE SULFATE 4 MG/ML IJ/IV SOLN (WRAP)
4.0000 mg | Freq: Once | Status: AC
Start: 2016-06-03 — End: 2016-06-03
  Administered 2016-06-03: 4 mg via INTRAVENOUS
  Filled 2016-06-03: qty 1

## 2016-06-03 MED ORDER — SODIUM CHLORIDE 0.9 % IV BOLUS
1000.0000 mL | Freq: Once | INTRAVENOUS | Status: AC
Start: 2016-06-03 — End: 2016-06-03
  Administered 2016-06-03: 1000 mL via INTRAVENOUS

## 2016-06-03 MED ORDER — ONDANSETRON HCL 4 MG/2ML IJ SOLN
4.0000 mg | Freq: Once | INTRAMUSCULAR | Status: AC
Start: 2016-06-03 — End: 2016-06-03
  Administered 2016-06-03: 4 mg via INTRAVENOUS
  Filled 2016-06-03: qty 2

## 2016-06-03 MED ORDER — IOHEXOL 350 MG/ML IV SOLN
100.0000 mL | Freq: Once | INTRAVENOUS | Status: AC | PRN
Start: 2016-06-03 — End: 2016-06-03
  Administered 2016-06-03: 100 mL via INTRAVENOUS

## 2016-06-03 NOTE — Discharge Instructions (Signed)
Dear Mr. Alma Downs:    Thank you for choosing the [facility] Emergency Department, the premier emergency department in the [place] area.  I hope your visit today was EXCELLENT.    Specific instructions for your visit today:    Back Pain NOS    You have been seen for back pain.    Back pain can happen anywhere from the neck down to the low back. Back pain has many different causes. Some of the more common are: Bone pain, muscle strain, muscle spasm, pain from overuse, and pinched nerves. Other problems can cause what feels like back pain. But the pain is really coming from another organ. A kidney infection can cause lower back pain.    Your doctor did not find any pain over the bones in your back (even though you might have pain in the muscles of the back). This means it is very unlikely that you have a broken bone (fracture) in your back. Your doctor did not think it was necessary to take an x-ray.    The doctor still does not know the exact cause of your pain. Your problem does not seem to be from a dangerous cause. It is OK for you to go home today.    Some things you can try to help your back feel better are:   Apply a warm damp washcloth to the back where you have pain for 20 minutes at a time, at least 4 times per day.   Have someone massage the sore parts of your back.   Don t do any heavy lifting or bending. You can go back to normal daily activities if they don t make the pain worse.   You can use anti-inflammatory pain medicine for your pain. This could be Ibuprofen (Advil or Motrin). You can buy these at most stores. Follow the directions on the package.    This pain may last for the next few days. If your pain gets better, you probably do not need to see a doctor. However, if your symptoms get worse or you have new symptoms, you should return here or go to the nearest Emergency Department.    Call your doctor or go to the nearest Emergency Department if you your pain does not improve  within 4 weeks or your pain is bad enough to seriously limit your normal activities.    YOU SHOULD SEEK MEDICAL ATTENTION IMMEDIATELY, EITHER HERE OR AT THE NEAREST EMERGENCY DEPARTMENT, IF ANY OF THE FOLLOWING OCCURS:   You think the pain is coming from somewhere other than your back. This can include chest pain. This is sometimes from angina (heart pains) or other dangerous causes.   You have shortness of breath, sweating, chest pain (or pressure, heaviness, indigestion, etc).   You have abdominal (belly) pain that goes through to your back.   Your arms and legs tingle or get numb (lose feeling).   Your arms or legs are weak.   You lose control of your bladder or bowels. If this were to happen, it may cause you to wet or soil yourself.   You have problems urinating (peeing).   You have fever (temperature higher than 100.64F / 38C).   Your pain gets worse.    If you do not continue to improve or your condition worsens, please contact your doctor or return immediately to the Emergency Department.    Sincerely,  Gracelyn Nurse, MD  Attending Emergency Physician  [facility] Emergency Department  ONSITE PHARMACY  Our full service onsite pharmacy is located in the ER waiting room.  Open 7 days a week from 9 am to 11 pm.  We accept all major insurances and prices are competitive with major retailers.  Ask your provider to print your prescriptions down to the pharmacy to speed you on your way home.    OBTAINING A PRIMARY CARE APPOINTMENT    Primary care physicians (PCPs, also known as primary care doctors) are either internists or family medicine doctors. Both types of PCPs focus on health promotion, disease prevention, patient education and counseling, and treatment of acute and chronic medical conditions.    Call for an appointment with a primary care doctor.  Ask to see who is taking new patients.     [facility]  telephone:  [phone]  [email or web address]    DOCTOR REFERRALS  Call  [phone] (available 24 hours a day, 7 days a week) if you need any further referrals and we can help you find a primary care doctor or specialist.  Also, available online at:  [email or web address]    YOUR CONTACT INFORMATION  Before leaving please check with registration to make sure we have an up-to-date contact number.  You can call registration at [phone] to update your information.  For questions about your hospital bill, please call [phone].  For questions about your Emergency Dept Physician bill please call [phone].      FREE HEALTH SERVICES  If you need help with health or social services, please call 2-1-1 for a free referral to resources in your area.  2-1-1 is a free service connecting people with information on health insurance, free clinics, pregnancy, mental health, dental care, food assistance, housing, and substance abuse counseling.  Also, available online at:  http://www.[facility].org    MEDICAL RECORDS AND TESTS  Certain laboratory test results do not come back the same day, for example urine cultures.   We will contact you if other important findings are noted.  Radiology films are often reviewed again to ensure accuracy.  If there is any discrepancy, we will notify you.      Please call [phone] to pick up a complimentary CD of any radiology studies performed.  If you or your doctor would like to request a copy of your medical records, please call [phone].      ORTHOPEDIC INJURY   Please know that significant injuries can exist even when an initial x-ray is read as normal or negative.  This can occur because some fractures (broken bones) are not initially visible on x-rays.  For this reason, close outpatient follow-up with your primary care doctor or bone specialist (orthopedist) is required.    MEDICATIONS AND FOLLOWUP  Please be aware that some prescription medications can cause drowsiness.  Use caution when driving or operating  machinery.    The examination and treatment you have received in our Emergency Department is provided on an emergency basis, and is not intended to be a substitute for your primary care physician.  It is important that your doctor checks you again and that you report any new or remaining problems at that time.      24 HOUR PHARMACIES  The nearest 24 hour pharmacy is:    [facility]  [address]  [place], [place] [address]  [phone]      ASSISTANCE WITH INSURANCE    Affordable Care Act  [facility])  Call to start or finish an application, compare plans, enroll  or ask a question.  [phone]  TTY: [phone]  Web:  Healthcare.gov    Help Enrolling in [facility]  [facility] [place]  [phone] (TOLL-FREE)  [phone] (TTY)  Web:  Http://www.coverva.org    Local Help Enrolling in the [facility]  [place] [facility]  (704) [phone] (MAIN)  Email:  health-help@nvfs .org  Web:  BlackjackMyths.is  Address:  [address], [address] [place], [place] [address]    SEDATING MEDICATIONS  Sedating medications include strong pain medications (e.g. narcotics), muscle relaxers, benzodiazepines (used for anxiety and as muscle relaxers), Benadryl/diphenhydramine and other antihistamines for allergic reactions/itching, and other medications.  If you are unsure if you have received a sedating medication, please ask your physician or nurse.  If you received a sedating medication: DO NOT drive a car. DO NOT operate machinery. DO NOT perform jobs where you need to be alert.  DO NOT drink alcoholic beverages while taking this medicine.     If you get dizzy, sit or lie down at the first signs. Be careful going up and down stairs.  Be extra careful to prevent falls.     Never give this medicine to others.     Keep this medicine out of reach of children.     Do not take or save old medicines. Throw them away when outdated.     Keep all medicines in a cool, dry place. DO NOT keep them in your bathroom  medicine cabinet or in a cabinet above the stove.    MEDICATION REFILLS  Please be aware that we cannot refill any prescriptions through the ER. If you need further treatment from what is provided at your ER visit, please follow up with your primary care doctor or your pain management specialist.    FREESTANDING EMERGENCY DEPARTMENTS OF [facility]  Did you know [facility] has two freestanding ERs located just a few miles away?  [facility] [place] have short wait times, easy free parking directly in front of the building and top patient satisfaction scores - and the same Board Certified Emergency Medicine doctors as [facility].              ARCHER MOIST  [id number]  [phone]  [phone]  06/02/2016    Discharge Instructions    As always, you are the most important factor in your recovery.  Please follow these instructions carefully.  If you have problems that we have not discussed, CALL OR VISIT YOUR DOCTOR RIGHT AWAY.     If you can't reach your doctor, return to the emergency department.    I Alvis Lemmings understand the written and discussed instructions.  My questions have been answered.  I acknowledge receipt of these instructions.     Patient or responsible person:         Patient's Signature               Physician or Nurse

## 2016-06-03 NOTE — ED Notes (Signed)
Wife at bedside.    Patient seen post fall.  States he was walking and slipped down the last stair hitting R lower back on stairs.  Pain to R flank, 8 out of 10, constant.  Redness and swelling noted to area.  Reports occasional numbness and tingling to RLE.  Denies currently. Able to move all extremities.

## 2016-06-03 NOTE — ED Notes (Signed)
Creatinine 1.0 via I-Stat

## 2016-06-03 NOTE — ED Notes (Signed)
Unable to provide urine at this time.  Urinal at bedside.

## 2016-06-03 NOTE — ED Provider Notes (Signed)
Physician/Midlevel provider first contact with patient: 06/03/16 0057         History     Chief Complaint   Patient presents with   . Back Pain     The history is provided by the patient. No language interpreter was used.      Pt slipped and fell while walking down the stairs, fell down and hit lower back on edge of step  Able to stand up with help and walk upstairs but c/o severe r lower back and flank pain no radiation no numbness tingling or weakness in legs no loss of bladder or bowel function  No cp or sob  Denies feeling lightheaded faint or dizzy pior to fall    Nursing (triage) note reviewed for the following pertinent information:  patient slipped and fell down stairs. Hit right flank on stair. No LOC, did not hit head. No head, neck, or back pain other than right flank. Patient recieved total of of fentanyl pta by EMS, patient appears very uncomfortable in triage.  moving all extremities, denies tingling or numbness,     Past Medical History   Diagnosis Date   . Gastroesophageal reflux disease        Past Surgical History   Procedure Laterality Date   . Appendectomy         History reviewed. No pertinent family history.    Social  Social History   Substance Use Topics   . Smoking status: Current Every Day Smoker   . Smokeless tobacco: None   . Alcohol Use: Yes       .     No Known Allergies    Home Medications                   bismuth subsalicylate (PEPTO BISMOL) 262 MG chewable tablet     Chew 524 mg by mouth 4 times daily - with meals and at bedtime.             Review of Systems   All other systems reviewed and are negative.      Physical Exam    BP: 118/78 mmHg, Heart Rate: 65, Temp: 98.4 F (36.9 C), Resp Rate: 17, SpO2: 98 %    Physical Exam   Constitutional: He is oriented to person, place, and time. He appears well-developed and well-nourished. No distress.   HENT:   Head: Normocephalic and atraumatic.   Right Ear: External ear normal.   Left Ear: External ear normal.   Nose: Nose normal.    Eyes: Conjunctivae and EOM are normal. Right eye exhibits no discharge. Left eye exhibits no discharge.   Neck: Normal range of motion and phonation normal. Neck supple. No tracheal deviation present.   Cardiovascular: Normal rate and regular rhythm.    Pulses:       Radial pulses are 2+ on the right side, and 2+ on the left side.   Pulmonary/Chest: Effort normal. No respiratory distress.   Abdominal: Soft. He exhibits no distension. There is no tenderness.   Musculoskeletal: Normal range of motion.   ttp along r lumbar paraspinous area  + ttp OVER R CVA area  Neg st leg raise   Strength 5/5 BLE     Neurological: He is alert and oriented to person, place, and time. No cranial nerve deficit. GCS eye subscore is 4. GCS verbal subscore is 5. GCS motor subscore is 6.   Skin: Skin is warm and dry. He is not diaphoretic.  Psychiatric: He has a normal mood and affect. His speech is normal.   Nursing note and vitals reviewed.        MDM and ED Course     ED Medication Orders     Start Ordered     Status Ordering Provider    06/03/16 0058 06/03/16 0057  morphine injection 4 mg   Once     Route: Intravenous  Ordered Dose: 4 mg     Ordered Kanye Depree    06/03/16 0058 06/03/16 0057  ondansetron (ZOFRAN) injection 4 mg   Once     Route: Intravenous  Ordered Dose: 4 mg     Thomasene Lot, Jaece Ducharme    06/03/16 0053 06/03/16 0052  sodium chloride 0.9 % bolus 1,000 mL   Once     Route: Intravenous  Ordered Dose: 1,000 mL     Thomasene Lot, Kristin Lamagna             MDM  Number of Diagnoses or Management Options     Amount and/or Complexity of Data Reviewed  Clinical lab tests: ordered and reviewed  Tests in the radiology section of CPT: ordered and reviewed  Discuss the patient with other providers: yes    Risk of Complications, Morbidity, and/or Mortality  Presenting problems: moderate  Diagnostic procedures: moderate  Management options: moderate              Procedures    Clinical Impression & Disposition     Presumptive Diagnosis:  fall     Treatment Plan: labs ct scan meds ivf     _______________________________    I reviewed the vital signs, nursing notes, past medical history, past surgical history, family history and social history.    Vital Signs - Patient Vitals for the past 12 hrs:   BP Temp Pulse Resp   06/03/16 0004 118/78 mmHg 98.4 F (36.9 C) 65 17       Pulse Oximetry Analysis - Normal    Differential Diagnosis (not completely inclusive): intra abdominal injury, contusion,fracture     EKG - Reviewed and Interpreted by Tommi Rumps, PA-C:        Pulse Oximetry Analysis interpreted by me - Normal on RA 98%  Cardiac Monitor Analysis interpreted by me - Normal Sinus Rhythm at rate of   Laboratory results reviewed by me:  yes  Radiologic study results reviewed by me: yes  Radiologic Studies Interpreted (viewed) by me: no  Radiologic Interpretation by me: No acute abnormalities     Labs -     Results     ** No results found for the last 24 hours. **          Radiologic Studies -   Radiology Results (24 Hour)     ** No results found for the last 24 hours. **      .    Procedures:     Tommi Rumps, PA-C  1:00 AM                        Clinical Impression  Final diagnoses:   None        ED Disposition     None           New Prescriptions    No medications on file                   Tommi Rumps, [place]  06/03/16 0101

## 2016-06-03 NOTE — ED Notes (Signed)
Call placed to CT to check status of exam.  On the way to transport patient.

## 2016-06-03 NOTE — ED Provider Notes (Signed)
[facility] EMERGENCY DEPARTMENT H&P                                             ATTENDING SUPERVISORY NOTE             CLINICAL SUMMARY          Diagnosis:    .     Final diagnoses:   Back pain, unspecified back location, unspecified back pain laterality, unspecified chronicity         MDM Notes:      No fracture on xray, patient has good strength, reflexes and no numbness/tingling. No evidence of spinal cord injury, cauda equina, discitis, spinal abscess or fracture. Pt has stable vitals. Pain is over paraspinal region. Patient able to range hips/knees/ankles without difficulty. No other findings on CT scan. Patient pain improved with medication.     Based on the patient's clinical presentation, vital signs, and diagnostic studies, I feel the patient is safe for discharge. The work up has shown no signs of life-threatening emergency. The patient understands that follow up is needed, and if unable to do this - the patient should return to the ER for a repeat evaluation.    The patient/family understands their instructions and the patient is well-appearing at time of discharge. I explained results and discharge instructions to the patient/family. The patient/family acknowledges understanding and agrees with plan.          Disposition:      ED Disposition     Discharge Tyrin Herbers Hagberg discharge to home/self care.    Condition at disposition: Stable                      ATTENDING NOTE      The patient was seen and examined by the mid-level (physician's assistant or nurse practitioner), resident or fellow, and the plan of care was discussed with me. I agree with the plan as it was presented to me.  I have reviewed and agree with the final ED diagnosis.    I spoke to and examined the patient as well: Yes  I was present during key portions of any procedures performed: N/A    AUTRY PRUST is a 40 y.o. male  has a past medical history of Gastroesophageal reflux disease. who p/w  severe lower R back pain and R flank pain that developed following mechanical trip and fall while going down stairs. No LOC, no head injury. He denies CP, SOB, n/v/d, fever, paresthesias, motor deficits, incontinence, sick contacts. No regular meds      REVIEW OF SYSTEMS: Positive and negative ROS elements as per HPI.  All other systems reviewed and negative.    Constitutional: Vital signs reviewed. Well appearing.  Head: Normocephalic, atraumatic  Eyes: No conjunctival injection. No discharge. PERRL, EOMI  ENT: Mucous membranes moist  Neck: Normal range of motion. Trachea midline.  Respiratory/Chest: No dyspnea or work of breathing.  Back: no CVA tenderness, no T-L spine tenderness +left lumbar paraspinal tenderness  Upper Extremities:No edema. No cyanosis.  Lower Extremities: No edema. No cyanosis. FROM, no deformity of knees/ankles/hips. Radial pulse 2+  Neurological: Strength 5/5 in LE. DTRs 2+ in Ankles and knees. Alert Normal and symmetric motor tone by observation. Speech normal. Memory Normal. No facial assymetry.  Skin: Warm and dry. No rash.  Psychiatric: Normal affect. Normal concentration.  VISIT INFORMATION        Clinical Course in the ED:            Medications Given in the ED:    .     ED Medication Orders     Start Ordered     Status Ordering Provider    06/03/16 [phone] 06/03/16 0337  morphine injection 4 mg   Once     Route: Intravenous  Ordered Dose: 4 mg     Ordered Manvi Guilliams    06/03/16 0256 06/03/16 0255  morphine injection 4 mg   Once     Route: Intravenous  Ordered Dose: 4 mg     Last MAR action:  Given Shariq Puig, Viviann Spare    06/03/16 0058 06/03/16 0057  morphine injection 4 mg   Once     Route: Intravenous  Ordered Dose: 4 mg     Last MAR action:  Given NAYYAR, ASHNA    06/03/16 0058 06/03/16 0057  ondansetron (ZOFRAN) injection 4 mg   Once     Route: Intravenous  Ordered Dose: 4 mg     Last MAR action:  Given NAYYAR, ASHNA    06/03/16 0053 06/03/16 0052  sodium chloride 0.9 % bolus  1,000 mL   Once     Route: Intravenous  Ordered Dose: 1,000 mL     Last MAR action:  Stopped NAYYAR, ASHNA            Procedures:      Procedures      Interpretations:      Differential Diagnosis considered by MD (not completely inclusive): spinal vertebrae fracture, cauda equina, transverse myelitis, conus medullaris syndrome, musculoskeletal strain, spinal stenosis, osteoarthritis,    EKG was Reviewed, Analyzed and Interpreted by me: N/A    Rhythm Strip Interpretation / Cardiac Monitor Analysis: N/A    Pulse Ox Analysis: Yes: saturation: 98 %; Oxygen use: room air; Interpretation: Normal    Critical Care Time: No Critical Care Time     Laboratory results reviewed and interpreted by me: Yes    Radiologic study results reviewed by me: Yes    Radiologic Studies Interpreted (viewed) by me: Yes    Discuss the patient with other providers: N/A    All tests, tracings, EKGs, vital signs and radiological studies above where applicable were interpreted by me, Gracelyn Nurse MD.            PAST HISTORY        Primary Care Provider: Hortencia Conradi, MD        PMH/PSH:    .     Past Medical History   Diagnosis Date   . Gastroesophageal reflux disease        He has past surgical history that includes Appendectomy.      Social/Family History:      He reports that he has been smoking.  He does not have any smokeless tobacco history on file. He reports that he drinks alcohol. His drug history is not on file.    History reviewed. No pertinent family history.      Listed Medications on Arrival:    .     Previous Medications    BISMUTH SUBSALICYLATE (PEPTO BISMOL) 262 MG CHEWABLE TABLET    Chew 524 mg by mouth 4 times daily - with meals and at bedtime.        Allergies: He has No Known Allergies.            RESULTS  Lab Results:      Results     Procedure Component Value Units Date/Time    UA, Reflex to Microscopic (pts  3 + yrs) [[id number]]  (Abnormal) Collected:  06/03/16 0146    Specimen Information:  Urine Updated:   06/03/16 0251     Urine Type Clean Catch      Color, UA Yellow      Clarity, UA Clear      Specific Gravity UA 1.026      Urine pH 5.0      Leukocyte Esterase, UA Negative      Nitrite, UA Negative      Protein, UR 30 (A)      Glucose, UA Negative      Ketones UA 5 (A)      Urobilinogen, UA Normal mg/dL      Bilirubin, UA Negative      Blood, UA Negative      RBC, UA 3 - 5 /hpf      WBC, UA 0 - 5 /hpf      Urine Mucus Present     i-Stat Chem 8 CartrIDge [[id number]]  (Abnormal) Collected:  06/03/16 0120     i-STAT Glucose 111 (H) mg/dL Updated:  78/29/56 2130     i-STAT BUN 13 mg/dL      i-STAT Creatinine 1.0 mg/dL      i-STAT Sodium 865 mEq/L      i-STAT Potassium 3.7 mEq/L      i-STAT Chloride 101 mEq/L      i-STAT CO2 28 mEq/L      i-STAT Hematocrit 39.0 (L) %      i-STAT Hemoglobin 13.3 g/dL      i-STAT Calcium Ionized 2.50 mEq/L     i-Stat Chem 8 CartrIDge [[id number]]  (Abnormal) Collected:  06/03/16 0141     i-STAT Glucose 81 mg/dL Updated:  28/41/32 4401     i-STAT BUN 13 mg/dL      i-STAT Creatinine 1.1 mg/dL      i-STAT Sodium 027 mEq/L      i-STAT Potassium 3.7 mEq/L      i-STAT Chloride 102 mEq/L      i-STAT CO2 27 mEq/L      i-STAT Hematocrit 36.0 (L) %      i-STAT Hemoglobin 12.2 (L) g/dL      i-STAT Calcium Ionized 2.40 mEq/L     PT/APTT [[id number]] Collected:  06/03/16 0113     PT 13.1 sec Updated:  06/03/16 0145     PT INR 1.0      PT Anticoag. Given Within 48 hrs. None      PTT 24 sec     CBC with differential [[id number]]  (Abnormal) Collected:  06/03/16 0113    Specimen Information:  Blood from Blood Updated:  06/03/16 0128     WBC 7.70 x10 3/uL      Hgb 12.3 (L) g/dL      Hematocrit 87.5 (L) %      Platelets 169 x10 3/uL      RBC 4.18 (L) x10 6/uL      MCV 85.6 fL      MCH 29.4 pg      MCHC 34.4 g/dL      RDW 14 %      MPV 11.7 fL      Neutrophils 87.2 %      Lymphocytes Automated 3.9 %      Monocytes 7.7 %      Eosinophils Automated 0.4 %  Basophils Automated 0.4 %      Immature Granulocyte  0.4 %      Nucleated RBC 0.0 /100 WBC      Neutrophils Absolute 6.72 x10 3/uL      Abs Lymph Automated 0.30 (L) x10 3/uL      Abs Mono Automated 0.59 x10 3/uL      Abs Eos Automated 0.03 x10 3/uL      Absolute Baso Automated 0.03 x10 3/uL      Absolute Immature Granulocyte 0.03 x10 3/uL      Absolute NRBC 0.00 x10 3/uL               Radiology Results:      CT Abd/ Pelvis with IV Contrast   Final Result     CT scan of the abdomen and pelvis shows no acute   traumatic abnormality.      The examination shows no evidence of   mesenteric or visceral injury.              Incidental note is made of: Colonic diverticulosis, small sliding hiatal   hernia, appendectomy changes, tiny umbilical hernia.  Other findings as   noted above.                  Miguel Dibble, MD    06/03/2016 3:18 AM         CT L-spine Reconstruction   Final Result    CT scan of the lumbar spine shows no apparent   abnormality.         The lumbar spine shows no apparent injury.                             Miguel Dibble, MD    06/03/2016 3:07 AM                     Scribe Attestation:      Attestations:  I was acting as a Neurosurgeon for Gracelyn Nurse, MD on Alvis Lemmings  Scribe: Audelia Hives   I am the first provider for this patient and I personally performed the services documented. Audelia Hives is scribing for me on Learn,Olie C. This note accurately reflects work and decisions made by me.  Gracelyn Nurse, MD                           Gracelyn Nurse, MD  06/03/16 [phone]

## 2018-06-09 HISTORY — PX: ESOPHAGOGASTRODUODENOSCOPY: SHX1529

## 2018-08-06 HISTORY — PX: COLONOSCOPY WITH ESOPHAGOGASTRODUODENOSCOPY (EGD): SHX5779

## 2020-10-05 ENCOUNTER — Encounter: Payer: Self-pay | Admitting: Gastroenterology

## 2020-10-29 ENCOUNTER — Encounter: Payer: Self-pay | Admitting: Gastroenterology

## 2020-10-29 ENCOUNTER — Ambulatory Visit: Payer: Managed Care, Other (non HMO) | Admitting: Gastroenterology

## 2020-10-29 VITALS — BP 110/70 | HR 83 | Ht 72.0 in | Wt 190.5 lb

## 2020-10-29 DIAGNOSIS — K449 Diaphragmatic hernia without obstruction or gangrene: Secondary | ICD-10-CM

## 2020-10-29 DIAGNOSIS — K22719 Barrett's esophagus with dysplasia, unspecified: Secondary | ICD-10-CM

## 2020-10-29 DIAGNOSIS — K219 Gastro-esophageal reflux disease without esophagitis: Secondary | ICD-10-CM | POA: Diagnosis not present

## 2020-10-29 NOTE — Progress Notes (Signed)
Chief Complaint: GERD, Barrett's esophagus  Referring Provider:     Self   HPI:     Joel Barry is a 44 y.o. male presenting to the Gastroenterology Clinic for evaluation of GERD and to discuss antireflux surgical options, namely Transoral Incisionless Fundoplication (TIF) with a goal to stop or significantly reduce acid suppression therapy.  Long-standing hx of GERD since late 20's. +nocturnal sxs.  Can still have breakthrough symptoms despite HOB elevated, avoids eating close to bedtime. Travels for work frequently, which worsens sxs due to disruption of routines.  Feels that his reflux symptoms are worsening despite continued PPI and mitigation strategies.  He was researching alternative treatment strategies and is very interested in TIF, so he sought care with [facility].  Has otherwise been followed by Dr. Danielle Dess in [place], [place].   GERD history: -Index symptoms: Heartburn, regurgitation, nocturnal cough -Exacerbating features: roasted red peppers, eating close to bedtime, coffee -Medications trialed: Dexilant, Protonix -Current medications: Protonix 40 mg/day -Complications: Barrett's Esophagus, hiatal hernia  GERD evaluation: -Last EGD: 08/2019 -Barium esophagram: None -Esophageal Manometry: None -pH/Impedance: None -Bravo: None  Endoscopic History: -EGD (Dr. 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 Danielle Dess, 09/16/2019): Medium sized HH, short segment Barrett's esophagus maximal extent 3 cm (path: Intestinal metaplasia without dysplasia), Patchy mild gastritis, normal duodenum   GERD-HRQL Questionnaire Score: 6/50 (on PPI).  Marked "dissatisfied" with current health condition.  High amounts of regurgitation on scoring (25/30 points)  Previously followed by Dr. Barnetta Chapel, MD at [facility], last seen 11/07/2019.  Plan at that time was for continue Dexilant 60 mg/day and Protonix 40 mg/day and follow-up in 1 year.   Past Medical History:  Diagnosis Date  . Acid reflux    from referral source  . Barrett's esophagus   . GERD (gastroesophageal reflux disease)    from referral source  . Hiatal hernia    per referral notes     Past Surgical History:  Procedure Laterality Date  . APPENDECTOMY  07/2009  . ESOPHAGOGASTRODUODENOSCOPY  2020   [facility]. Dr. Danielle Dess   Family History  Problem Relation Age of Onset  . Melanoma Paternal Grandmother   . Colon polyps Neg Hx   . Colon cancer Neg Hx   . Esophageal cancer Neg Hx    Social History   Tobacco Use  . Smoking status: Former Games developer  . Smokeless tobacco: Never Used  Vaping Use  . Vaping Use: Every day  Substance Use Topics  . Alcohol use: Yes  . Drug use: Never   Current Outpatient Medications  Medication Sig Dispense Refill  . CALCIUM PO Take 2 tablets by mouth daily. Take 2 gummies daily    . finasteride (PROPECIA) 1 MG tablet Take 1 mg by mouth daily.    . Multiple Vitamins-Minerals (MENS MULTIVITAMIN PO) Take 1 tablet by mouth daily.    . pantoprazole (PROTONIX) 40 MG tablet Take 40 mg by mouth daily.     No current facility-administered medications for this visit.   Not on File   Review of Systems: All systems reviewed and negative except where noted in HPI.     Physical Exam:    Wt Readings from Last 3 Encounters:  10/29/20 190 lb 8 oz (86.4 kg)    BP 110/70  Pulse 83   Ht 6' (1.829 m)   Wt 190 lb 8 oz (86.4 kg)   BMI 25.84 kg/m  Constitutional:  Pleasant, in no acute distress. Psychiatric: Normal mood and affect. Behavior is normal. EENT: Pupils normal.  Conjunctivae are normal. No scleral icterus. Neck supple. No cervical LAD. Cardiovascular: Normal rate, regular rhythm. No edema Pulmonary/chest: Effort normal and breath sounds normal. No wheezing, rales or  rhonchi. Abdominal: Soft, nondistended, nontender. Bowel sounds active throughout. There are no masses palpable. No hepatomegaly. Neurological: Alert and oriented to person place and time. Skin: Skin is warm and dry. No rashes noted.   ASSESSMENT AND PLAN;   1) GERD 2) Hiatal hernia 3) Nondysplastic Barrett's Esophagus  Had a long discussion today regarding the pathophysiology of reflux and potential complications, to include progression of his already diagnosed Barrett's Esophagus.  Discussed treatment strategies to include continued PPI vs antireflux surgery.  He is strongly in favor of the latter as a means to better control his reflux, reduce risk of progression of Barrett's Esophagus, and if possible reduce dose or potentially stop need for acid suppression therapy.  Plan for the following:  -EGD to evaluate size/grade of hiatal hernia and for preoperative assessment  -Evaluate Barrett's Esophagus with esophageal biopsies -Discussed antireflux surgery in the setting of Barrett's Esophagus with particular regard to ongoing surveillance after surgery.  This area is not as widely developed as the Barrett's population.  With that said, we are typically still able to evaluate the esophageal lumen/mucosal surface after TIF, so there should not be any degradation of surveillance ability.  I did discuss the potential need for ongoing PPI given the underlying Barrett's, which he understands.  There are otherwise good data to support antireflux surgery in nondysplastic Barrett's as a means to reduce progression of disease and improve QOL -Suspect he will have a good size hiatal hernia, in which case we discussed concomitant laparoscopic hiatal hernia repair  The indications, risks, and benefits of EGD were explained to the patient in detail. Risks include but are not limited to bleeding, perforation, adverse reaction to medications, and cardiopulmonary compromise. Sequelae include but are not limited  to the possibility of surgery, hositalization, and mortality. The patient verbalized understanding and wished to proceed. All questions answered, referred to scheduler. Further recommendations pending results of the exam.      Joel Dike Tanji Storrs, DO, FACG  10/29/2020, 9:47 AM   No ref. provider found

## 2020-10-29 NOTE — Patient Instructions (Signed)
If you are age 44 or older, your body mass index should be between 23-30. Your Body mass index is 25.84 kg/m. If this is out of the aforementioned range listed, please consider follow up with your Primary Care Provider.  If you are age 21 or younger, your body mass index should be between 19-25. Your Body mass index is 25.84 kg/m. If this is out of the aformentioned range listed, please consider follow up with your Primary Care Provider.   You have been scheduled for an endoscopy. Please follow written instructions given to you at your visit today. If you use inhalers (even only as needed), please bring them with you on the day of your procedure.  Due to recent changes in healthcare laws, you may see the results of your imaging and laboratory studies on MyChart before your provider has had a chance to review them.  We understand that in some cases there may be results that are confusing or concerning to you. Not all laboratory results come back in the same time frame and the provider may be waiting for multiple results in order to interpret others.  Please give [place] 48 hours in order for your provider to thoroughly review all the results before contacting the office for clarification of your results.

## 2020-11-20 ENCOUNTER — Encounter: Payer: Self-pay | Admitting: Gastroenterology

## 2020-12-04 ENCOUNTER — Ambulatory Visit (AMBULATORY_SURGERY_CENTER): Payer: Managed Care, Other (non HMO) | Admitting: Gastroenterology

## 2020-12-04 ENCOUNTER — Other Ambulatory Visit: Payer: Self-pay

## 2020-12-04 ENCOUNTER — Encounter: Payer: Self-pay | Admitting: Gastroenterology

## 2020-12-04 VITALS — BP 100/64 | HR 62 | Temp 96.2°F | Resp 18 | Ht 72.0 in | Wt 190.0 lb

## 2020-12-04 DIAGNOSIS — K219 Gastro-esophageal reflux disease without esophagitis: Secondary | ICD-10-CM | POA: Diagnosis not present

## 2020-12-04 DIAGNOSIS — K449 Diaphragmatic hernia without obstruction or gangrene: Secondary | ICD-10-CM | POA: Diagnosis not present

## 2020-12-04 DIAGNOSIS — K2270 Barrett's esophagus without dysplasia: Secondary | ICD-10-CM | POA: Diagnosis not present

## 2020-12-04 MED ORDER — SODIUM CHLORIDE 0.9 % IV SOLN: 500.0000 mL | Freq: Once | INTRAVENOUS | Status: DC

## 2020-12-04 NOTE — Progress Notes (Signed)
Pt's states no medical or surgical changes since previsit or office visit.  ° °Cw vitals  °

## 2020-12-04 NOTE — Progress Notes (Signed)
To PACU, VSS. Report to [email or web address] 

## 2020-12-04 NOTE — Op Note (Signed)
[facility] Patient Name: Joel Barry Procedure Date: 12/04/2020 10:02 AM MRN: [id number] Endoscopist: Doristine Locks , MD Age: 44 Referring MD:  Date of Birth: [birth year] Gender: Male Account #: [id number] Procedure:                Upper GI endoscopy Indications:              Esophageal reflux, Follow-up of Barrett's                            esophagus, Preoperative assessment for possible                            hiatal hernia repair and anti-reflux surgery,                            Evaluation of previously noted hiatal hernia Medicines:                Monitored Anesthesia Care Procedure:                Pre-Anesthesia Assessment:                           - Prior to the procedure, a History and Physical                            was performed, and patient medications and                            allergies were reviewed. The patient's tolerance of                            previous anesthesia was also reviewed. The risks                            and benefits of the procedure and the sedation                            options and risks were discussed with the patient.                            All questions were answered, and informed consent                            was obtained. Prior Anticoagulants: The patient has                            taken no previous anticoagulant or antiplatelet                            agents. ASA Grade Assessment: II - A patient with                            mild systemic disease. After reviewing the risks  and benefits, the patient was deemed in                            satisfactory condition to undergo the procedure.                           After obtaining informed consent, the endoscope was                            passed under direct vision. Throughout the                            procedure, the patient's blood pressure, pulse, and                            oxygen saturations  were monitored continuously. The                            Endoscope was introduced through the mouth, and                            advanced to the second part of duodenum. The upper                            GI endoscopy was accomplished without difficulty.                            The patient tolerated the procedure well. Scope In: Scope Out: Findings:                 Esophagogastric landmarks were identified: the                            gastroesophageal junction was found at 36 cm and                            the site of hiatal narrowing was found at 40 cm                            from the incisors.                           A 4 cm hiatal hernia was present.                           The gastroesophageal flap valve was visualized                            endoscopically and classified as Hill Grade IV (no                            fold, wide open lumen, hiatal hernia present).                           Circumferential salmon-colored  mucosa was present                            from 34 to 36 cm, with a single tongue of salmon                            colored mucosa extending to 33 cm. No other visible                            abnormalities were present. Mucosa was biopsied                            with a cold forceps for histology in 4 quadrants at                            33, 34, and 36 cm from the incisors. A total of 3                            specimen bottles were sent to pathology. Estimated                            blood loss was minimal.                           Aside from the LES laxity and hiatal hernia noted                            on retroflexion, the entire examined stomach was                            normal.                           The examined duodenum was normal. Complications:            No immediate complications. Estimated Blood Loss:     Estimated blood loss was minimal. Impression:               - Esophagogastric landmarks  identified.                           - 4 cm hiatal hernia.                           - Gastroesophageal flap valve classified as Hill                            Grade IV (no fold, wide open lumen, hiatal hernia                            present).                           - Salmon-colored mucosa classified as Barrett's  stage C2-M3 per Prague criteria. Biopsied.                           - Normal stomach.                           - Normal examined duodenum. Recommendation:           - Patient has a contact number available for                            emergencies. The signs and symptoms of potential                            delayed complications were discussed with the                            patient. Return to normal activities tomorrow.                            Written discharge instructions were provided to the                            patient.                           - Resume previous diet.                           - Continue present medications.                           - Await pathology results.                           - Return to GI clinic at appointment to be                            scheduled. Will plan to discuss ongoing treatment                            options, to include possible combined laparoscopic                            hiatal hernia repair with Transoral Incisionless                            Fundoplication (ie, cTIF). Doristine Locks, MD 12/04/2020 10:37:16 AM

## 2020-12-04 NOTE — Patient Instructions (Addendum)
HANDOUTS PROVIDED ON: Barrett's Esophagus and Hiatal Hernia  The biopsies taken today have been sent for pathology.  The results can take 1-3 weeks to receive.    You may resume your previous diet and medication schedule.  Thank you for allowing [place] to care for you today!!!    YOU HAD AN ENDOSCOPIC PROCEDURE TODAY AT THE [facility]:   Refer to the procedure report that was given to you for any specific questions about what was found during the examination.  If the procedure report does not answer your questions, please call your gastroenterologist to clarify.  If you requested that your care partner not be given the details of your procedure findings, then the procedure report has been included in a sealed envelope for you to review at your convenience later.  YOU SHOULD EXPECT: Some feelings of bloating in the abdomen. Passage of more gas than usual.  Walking can help get rid of the air that was put into your GI tract during the procedure and reduce the bloating.   Please Note:  You might notice some irritation and congestion in your nose or some drainage.  This is from the oxygen used during your procedure.  There is no need for concern and it should clear up in a day or so.  SYMPTOMS TO REPORT IMMEDIATELY:    Following upper endoscopy (EGD)  Vomiting of blood or coffee ground material  New chest pain or pain under the shoulder blades  Painful or persistently difficult swallowing  New shortness of breath  Fever of 100F or higher  Black, tarry-looking stools  For urgent or emergent issues, a gastroenterologist can be reached at any hour by calling [phone]. Do not use MyChart messaging for urgent concerns.    DIET:  We do recommend a small meal at first, but then you may proceed to your regular diet.  Drink plenty of fluids but you should avoid alcoholic beverages for 24 hours.  ACTIVITY:  You should plan to take it easy for the rest of today and you should NOT  DRIVE or use heavy machinery until tomorrow (because of the sedation medicines used during the test).    FOLLOW UP: Our staff will call the number listed on your records 48-72 hours following your procedure to check on you and address any questions or concerns that you may have regarding the information given to you following your procedure. If we do not reach you, we will leave a message.  We will attempt to reach you two times.  During this call, we will ask if you have developed any symptoms of COVID 19. If you develop any symptoms (ie: fever, flu-like symptoms, shortness of breath, cough etc.) before then, please call [phone].  If you test positive for Covid 19 in the 2 weeks post procedure, please call and report this information to [place].    If any biopsies were taken you will be contacted by phone or by letter within the next 1-3 weeks.  Please call us at [phone] if you have not heard about the biopsies in 3 weeks.    SIGNATURES/CONFIDENTIALITY: You and/or your care partner have signed paperwork which will be entered into your electronic medical record.  These signatures attest to the fact that that the information above on your After Visit Summary has been reviewed and is understood.  Full responsibility of the confidentiality of this discharge information lies with you and/or your care-partner.

## 2020-12-04 NOTE — Progress Notes (Signed)
Called to room to assist during endoscopic procedure.  Patient ID and intended procedure confirmed with present staff. Received instructions for my participation in the procedure from the performing physician.  

## 2020-12-06 ENCOUNTER — Telehealth: Payer: Self-pay

## 2020-12-06 NOTE — Telephone Encounter (Signed)
LVM

## 2020-12-10 ENCOUNTER — Telehealth: Payer: Self-pay | Admitting: General Surgery

## 2020-12-10 ENCOUNTER — Other Ambulatory Visit: Payer: Self-pay | Admitting: General Surgery

## 2020-12-10 DIAGNOSIS — K22719 Barrett's esophagus with dysplasia, unspecified: Secondary | ICD-10-CM

## 2020-12-10 DIAGNOSIS — K2270 Barrett's esophagus without dysplasia: Secondary | ICD-10-CM

## 2020-12-10 NOTE — Telephone Encounter (Signed)
Contacted the patient to discuss bx results,  esophageal manometry and referral to Dr Andrey Campanile. The patient was understanding and stated he will need to have his appointment after 10am since he is coming from [place].

## 2020-12-10 NOTE — Telephone Encounter (Signed)
Notified the patient of his covid and procedure time. Patient verbalized dates and times. Expressed to the patient I will send instructions in the mail today.

## 2020-12-10 NOTE — Telephone Encounter (Signed)
-----   Message from [place] V, DO sent at 12/10/2020  7:44 AM EST ----- The biopsies that I took during your recent procedure showed Barrett's Esophagus (intestinal metaplasia), but NO sign of the pre-cancerous change called "dysplasia."   This measured 3 cm in the maximal length. The length of the involved segment and the biopsies from this study are unchanged from the previously-diagnosed Barrett's Esophagus.   Plan for follow-up with me in the GI Clinic to discuss the role and goals of hiatal hernia repair and anti-reflux surgery in the setting of Barrett's Esophagus. If planning to proceed with surgery, will also refer for Esophageal Manometry and referral to Dr. Andrey Campanile for cTIF.

## 2020-12-10 NOTE — Telephone Encounter (Signed)
Patient scheduled for EM on 01/04/2021 at 1230pm at [facility]. Sheduled for Covid test 1/11/202 @ 11:00am Instructions typed and mailed to patient.

## 2020-12-10 NOTE — Telephone Encounter (Signed)
-----   Message from Vito Cirigliano V, DO sent at 12/10/2020  7:44 AM EST ----- The biopsies that I took during your recent procedure showed Barrett's Esophagus (intestinal metaplasia), but NO sign of the pre-cancerous change called "dysplasia."   This measured 3 cm in the maximal length. The length of the involved segment and the biopsies from this study are unchanged from the previously-diagnosed Barrett's Esophagus.   Plan for follow-up with me in the GI Clinic to discuss the role and goals of hiatal hernia repair and anti-reflux surgery in the setting of Barrett's Esophagus. If planning to proceed with surgery, will also refer for Esophageal Manometry and referral to Dr. Wilson for cTIF.   

## 2020-12-10 NOTE — Telephone Encounter (Signed)
Contacted the patient to go over bx results. Patient verbalized understanding and is willing to proceed with esophageal manometry, covid test, Referral to [facility] to see DR Andrey Campanile. Patient would like to have all appointments after 10am he is coming from [place], [place].

## 2020-12-11 ENCOUNTER — Telehealth: Payer: Self-pay | Admitting: General Surgery

## 2020-12-11 NOTE — Telephone Encounter (Signed)
Patient scheduled for testing and referral sent to [facility]

## 2021-01-01 ENCOUNTER — Other Ambulatory Visit (HOSPITAL_COMMUNITY)
Admission: RE | Admit: 2021-01-01 | Discharge: 2021-01-01 | Disposition: A | Discharge status: Home / Self Care | Payer: Managed Care, Other (non HMO) | Source: Ambulatory Visit | Attending: Gastroenterology | Admitting: Gastroenterology

## 2021-01-01 DIAGNOSIS — Z01812 Encounter for preprocedural laboratory examination: Secondary | ICD-10-CM | POA: Diagnosis present

## 2021-01-01 DIAGNOSIS — U071 COVID-19: Secondary | ICD-10-CM | POA: Diagnosis not present

## 2021-01-01 LAB — SARS CORONAVIRUS 2 (TAT 6-24 HRS): SARS Coronavirus 2: POSITIVE — AB

## 2021-01-04 ENCOUNTER — Ambulatory Visit (HOSPITAL_COMMUNITY)
Admission: RE | Admit: 2021-01-04 | Payer: Managed Care, Other (non HMO) | Source: Home / Self Care | Admitting: Gastroenterology

## 2021-01-04 ENCOUNTER — Encounter (HOSPITAL_COMMUNITY): Admission: RE | Payer: Self-pay | Source: Home / Self Care

## 2021-01-04 SURGERY — MANOMETRY, ESOPHAGUS

## 2021-02-02 NOTE — Progress Notes (Signed)
Patient tested + for Covid on 01/01/21.  Per [facility] policy no retest for 90 days.  Patient ok to have procedure on 2/16.  Attempted to call patient to let him know he doesn't need to come for Covid test on Monday 2/14.

## 2021-02-04 ENCOUNTER — Inpatient Hospital Stay (HOSPITAL_COMMUNITY)
Admission: RE | Admit: 2021-02-04 | Discharge: 2021-02-04 | Disposition: A | Discharge status: Home / Self Care | Payer: Managed Care, Other (non HMO) | Source: Ambulatory Visit

## 2021-02-06 ENCOUNTER — Ambulatory Visit (HOSPITAL_COMMUNITY)
Admission: RE | Admit: 2021-02-06 | Discharge: 2021-02-06 | Disposition: A | Discharge status: Home / Self Care | Payer: Managed Care, Other (non HMO) | Attending: Gastroenterology | Admitting: Gastroenterology

## 2021-02-06 ENCOUNTER — Telehealth: Payer: Self-pay | Admitting: Gastroenterology

## 2021-02-06 ENCOUNTER — Encounter (HOSPITAL_COMMUNITY)
Admission: RE | Disposition: A | Discharge status: Home / Self Care | Payer: Self-pay | Source: Home / Self Care | Attending: Gastroenterology

## 2021-02-06 DIAGNOSIS — K219 Gastro-esophageal reflux disease without esophagitis: Secondary | ICD-10-CM

## 2021-02-06 DIAGNOSIS — K449 Diaphragmatic hernia without obstruction or gangrene: Secondary | ICD-10-CM

## 2021-02-06 DIAGNOSIS — Z538 Procedure and treatment not carried out for other reasons: Secondary | ICD-10-CM | POA: Diagnosis present

## 2021-02-06 DIAGNOSIS — K2270 Barrett's esophagus without dysplasia: Secondary | ICD-10-CM | POA: Insufficient documentation

## 2021-02-06 SURGERY — CANCELLED PROCEDURE

## 2021-02-06 MED ORDER — LIDOCAINE VISCOUS HCL 2 % MT SOLN
OROMUCOSAL | Status: AC
  Filled 2021-02-06: qty 15

## 2021-02-06 SURGICAL SUPPLY — 2 items
FACESHIELD LNG OPTICON STERILE (SAFETY) IMPLANT
GLOVE BIO SURGEON STRL SZ8 (GLOVE) ×6 IMPLANT

## 2021-02-06 NOTE — Telephone Encounter (Signed)
Pt is requesting a call back from a nurse regarding the procedure he was supposed to have done at the hospital.

## 2021-02-06 NOTE — Telephone Encounter (Signed)
Spoke with the patient and explained Dr Barron Alvine would like me to schedule a Barium esophagram and then follow up in the office. Explained that someone would contact him from central scheduling. The patient requested that his follow up visit be virtual.  You will be contacted by [facility] Scheduling in the next 2 days to arrange a Barium Esophagram.  The number on your caller ID will be [phone], please answer when they call.  If you have not heard from them in 2 days please call [phone] to schedule.

## 2021-02-06 NOTE — Progress Notes (Signed)
Attempted esophageal manometry with patient. During insertion of the probe pt gagging and coughing as probe passing into esophagus. Pt stops drinking and brings head up. Pt states has bad gag reflex. One spray of cetacaine in back of throat to help with gag. Attempted insertion after cetacaine and patient still violent gag with not being able to keep chin to chest . Pt spit up clear liquid. After last attempt patient stated he could not tolerate the insertion of probe.  Pt stated he would call doctor to talk about options.

## 2021-02-12 ENCOUNTER — Other Ambulatory Visit: Payer: Self-pay

## 2021-02-12 ENCOUNTER — Telehealth: Payer: Self-pay | Admitting: General Surgery

## 2021-02-12 ENCOUNTER — Other Ambulatory Visit: Payer: Self-pay | Admitting: Gastroenterology

## 2021-02-12 ENCOUNTER — Ambulatory Visit
Admission: RE | Admit: 2021-02-12 | Discharge: 2021-02-12 | Disposition: A | Discharge status: Home / Self Care | Payer: Managed Care, Other (non HMO) | Source: Ambulatory Visit | Attending: Gastroenterology | Admitting: Gastroenterology

## 2021-02-12 DIAGNOSIS — K449 Diaphragmatic hernia without obstruction or gangrene: Secondary | ICD-10-CM | POA: Insufficient documentation

## 2021-02-12 DIAGNOSIS — K219 Gastro-esophageal reflux disease without esophagitis: Secondary | ICD-10-CM | POA: Insufficient documentation

## 2021-02-12 NOTE — Telephone Encounter (Signed)
Spoke with the patient and gave him results, I will also contact [facility] and find out when his appointment is scheduled with Dr Andrey Campanile, The patient doesn't know.

## 2021-02-12 NOTE — Telephone Encounter (Signed)
-----   Message from [name], DO sent at 02/12/2021  4:41 PM EST ----- Esophagram results reviewed.  Small hiatal hernia with mild gastroesophageal refluxate noted.  Otherwise normal luminal appearance and normal motility and 13 mm barium tablet passed into stomach without issue.  Please ensure patient has follow-up with Dr. Andrey Campanile to discuss combined hiatal hernia repair/TIF (cTIF).

## 2021-05-22 ENCOUNTER — Telehealth: Payer: Self-pay

## 2021-05-22 NOTE — Telephone Encounter (Signed)
Yes, that should work well. Thanks!

## 2021-05-22 NOTE — Telephone Encounter (Signed)
Received a call from [place] at [facility]. Patient was seen by Dr. Andrey Campanile on 05/15/21. Dr. Andrey Campanile would like to proceed with cTIF. You already have an additional cTIF case on the morning of Thursday, 07/04/21 at [facility]. Dr. Andrey Campanile would start this patient at 10:30 AM and you would follow at 11:30 AM. You will be in the [facility] that afternoon. Please advise if this is ok. Thanks

## 2021-05-23 ENCOUNTER — Other Ambulatory Visit: Payer: Self-pay

## 2021-05-23 DIAGNOSIS — K219 Gastro-esophageal reflux disease without esophagitis: Secondary | ICD-10-CM

## 2021-05-23 DIAGNOSIS — K449 Diaphragmatic hernia without obstruction or gangrene: Secondary | ICD-10-CM

## 2021-05-23 NOTE — Telephone Encounter (Signed)
Received a return call from [place] at [facility]. Patient has been scheduled for cTIF at [facility] on 07/04/21 with Dr. Andrey Campanile starting at 10:30 am and Dr. Barron Alvine to follow at 11:30 am.  Post-op TIF diet and instructions mailed to patient today. Patient has been scheduled for a 2-week follow up post-TIF with Dr. Barron Alvine on Wednesday, 07/17/21 at 11:20 am. Spoke with patient and he is aware that I am placing the instructions and his follow up appt information in the mail today, he is aware that his follow up is at the [facility] office. Advised to let us know if he has not received it prior to his procedure. Pt confirmed address on file. Patient verbalized understanding and had no concerns at the end of the call.

## 2021-05-23 NOTE — Telephone Encounter (Signed)
Lm on vm for [place] at [facility] to confirm that 07/04/21 will work for cTIF with Dr. Andrey Campanile.

## 2021-06-27 ENCOUNTER — Ambulatory Visit: Payer: Self-pay | Admitting: General Surgery

## 2021-07-01 ENCOUNTER — Other Ambulatory Visit: Payer: Self-pay

## 2021-07-01 DIAGNOSIS — K449 Diaphragmatic hernia without obstruction or gangrene: Secondary | ICD-10-CM

## 2021-07-01 DIAGNOSIS — K219 Gastro-esophageal reflux disease without esophagitis: Secondary | ICD-10-CM

## 2021-07-01 NOTE — Patient Instructions (Addendum)
DUE TO COVID-19 ONLY ONE VISITOR IS ALLOWED TO COME WITH YOU AND STAY IN THE WAITING ROOM ONLY DURING PRE OP AND PROCEDURE.   **NO VISITORS ARE ALLOWED IN THE SHORT STAY AREA OR RECOVERY ROOM!!**  IF YOU WILL BE ADMITTED INTO THE HOSPITAL YOU ARE ALLOWED ONLY TWO SUPPORT PEOPLE DURING VISITATION HOURS ONLY (10AM -8PM)   The support person(s) may change daily. The support person(s) must pass our screening, gel in and out, and wear a mask at all times, including in the patient's room. Patients must also wear a mask when staff or their support person are in the room.  No visitors under the age of 37. Any visitor under the age of 3 must be accompanied by an adult.    COVID SWAB TESTING MUST BE COMPLETED ON:  07/02/21 @ 1200   [address] [place], [place] [address]   You are not required to quarantine, however you are required to wear a well-fitted mask when you are out and around people not in your household.  Hand Hygiene often Do NOT share personal items Notify your provider if you are in close contact with someone who has COVID or you develop fever 100.4 or greater, new onset of sneezing, cough, sore throat, shortness of breath or body aches.       Your procedure is scheduled on: 07/04/21   Report to [facility]  Entrance    Report to admitting at 8:30 AM   Call this number if you have problems the morning of surgery [phone]   Do not eat food :After Midnight.   May have liquids until 7:30 AM day of surgery  CLEAR LIQUID DIET  Foods Allowed                                                                     Foods Excluded  Water, Black Coffee and tea, regular and decaf               liquids that you cannot  Plain Jell-O in any flavor  (No red)                                     see through such as: Fruit ices (not with fruit pulp)                                            milk, soups, orange juice              Iced Popsicles (No red)                                                 All solid food                                   Apple juices Sports drinks like Gatorade (  No red) Lightly seasoned clear broth or consume(fat free) Sugar, honey syrup      The day of surgery:  Drink ONE (1) Pre-Surgery Clear Ensure by 7:30 am the morning of surgery. Drink in one sitting. Do not sip.  This drink was given to you during your hospital  pre-op appointment visit. Nothing else to drink after completing the  Pre-Surgery Clear Ensure.          If you have questions, please contact your surgeon's office.     Oral Hygiene is also important to reduce your risk of infection.                                    Remember - BRUSH YOUR TEETH THE MORNING OF SURGERY WITH YOUR REGULAR TOOTHPASTE   Do NOT smoke after Midnight   Take these medicines the morning of surgery with A SIP OF WATER: Finasteride, Pantoprazole.                               You may not have any metal on your body including jewelry, and body piercing             Do not wear lotions, powders, cologne, or deodorant              Men may shave face and neck.   Do not bring valuables to the hospital. [facility] IS NOT             RESPONSIBLE   FOR VALUABLES.   Bring small overnight bag day of surgery.   Special Instructions: Bring a copy of your healthcare power of attorney and living will documents         the day of surgery if you haven't scanned them in before.   Please read over the following fact sheets you were given: IF YOU HAVE QUESTIONS ABOUT YOUR PRE OP INSTRUCTIONS PLEASE CALL [phone]    - Preparing for Surgery Before surgery, you can play an important role.  Because skin is not sterile, your skin needs to be as free of germs as possible.  You can reduce the number of germs on your skin by washing with CHG (chlorahexidine gluconate) soap before surgery.  CHG is an antiseptic cleaner which kills germs and bonds with the skin to continue killing germs even after  washing. Please DO NOT use if you have an allergy to CHG or antibacterial soaps.  If your skin becomes reddened/irritated stop using the CHG and inform your nurse when you arrive at Short Stay. Do not shave (including legs and underarms) for at least 48 hours prior to the first CHG shower.  You may shave your face/neck.  Please follow these instructions carefully:  1.  Shower with CHG Soap the night before surgery and the  morning of surgery.  2.  If you choose to wash your hair, wash your hair first as usual with your normal  shampoo.  3.  After you shampoo, rinse your hair and body thoroughly to remove the shampoo.                             4.  Use CHG as you would any other liquid soap.  You can apply chg directly to the skin and wash.  Gently with a  scrungie or clean washcloth.  5.  Apply the CHG Soap to your body ONLY FROM THE NECK DOWN.   Do   not use on face/ open                           Wound or open sores. Avoid contact with eyes, ears mouth and   genitals (private parts).                       Wash face,  Genitals (private parts) with your normal soap.             6.  Wash thoroughly, paying special attention to the area where your    surgery  will be performed.  7.  Thoroughly rinse your body with warm water from the neck down.  8.  DO NOT shower/wash with your normal soap after using and rinsing off the CHG Soap.                9.  Pat yourself dry with a clean towel.            10.  Wear clean pajamas.            11.  Place clean sheets on your bed the night of your first shower and do not  sleep with pets. Day of Surgery : Do not apply any lotions/deodorants the morning of surgery.  Please wear clean clothes to the hospital/surgery center.  FAILURE TO FOLLOW THESE INSTRUCTIONS MAY RESULT IN THE CANCELLATION OF YOUR SURGERY  PATIENT SIGNATURE_________________________________  NURSE SIGNATURE__________________________________    WHAT IS A BLOOD TRANSFUSION? Blood  Transfusion Information  A transfusion is the replacement of blood or some of its parts. Blood is made up of multiple cells which provide different functions. Red blood cells carry oxygen and are used for blood loss replacement. White blood cells fight against infection. Platelets control bleeding. Plasma helps clot blood. Other blood products are available for specialized needs, such as hemophilia or other clotting disorders. BEFORE THE TRANSFUSION  Who gives blood for transfusions?  Healthy volunteers who are fully evaluated to make sure their blood is safe. This is blood bank blood. Transfusion therapy is the safest it has ever been in the practice of medicine. Before blood is taken from a donor, a complete history is taken to make sure that person has no history of diseases nor engages in risky social behavior (examples are intravenous drug use or sexual activity with multiple partners). The donor's travel history is screened to minimize risk of transmitting infections, such as malaria. The donated blood is tested for signs of infectious diseases, such as HIV and hepatitis. The blood is then tested to be sure it is compatible with you in order to minimize the chance of a transfusion reaction. If you or a relative donates blood, this is often done in anticipation of surgery and is not appropriate for emergency situations. It takes many days to process the donated blood. RISKS AND COMPLICATIONS Although transfusion therapy is very safe and saves many lives, the main dangers of transfusion include:  Getting an infectious disease. Developing a transfusion reaction. This is an allergic reaction to something in the blood you were given. Every precaution is taken to prevent this. The decision to have a blood transfusion has been considered carefully by your caregiver before blood is given. Blood is not given unless the benefits outweigh the risks. AFTER THE  TRANSFUSION Right after receiving a blood  transfusion, you will usually feel much better and more energetic. This is especially true if your red blood cells have gotten low (anemic). The transfusion raises the level of the red blood cells which carry oxygen, and this usually causes an energy increase. The nurse administering the transfusion will monitor you carefully for complications. HOME CARE INSTRUCTIONS  No special instructions are needed after a transfusion. You may find your energy is better. Speak with your caregiver about any limitations on activity for underlying diseases you may have. SEEK MEDICAL CARE IF:  Your condition is not improving after your transfusion. You develop redness or irritation at the intravenous (IV) site. SEEK IMMEDIATE MEDICAL CARE IF:  Any of the following symptoms occur over the next 12 hours: Shaking chills. You have a temperature by mouth above 102 F (38.9 C), not controlled by medicine. Chest, back, or muscle pain. People around you feel you are not acting correctly or are confused. Shortness of breath or difficulty breathing. Dizziness and fainting. You get a rash or develop hives. You have a decrease in urine output. Your urine turns a dark color or changes to pink, red, or brown. Any of the following symptoms occur over the next 10 days: You have a temperature by mouth above 102 F (38.9 C), not controlled by medicine. Shortness of breath. Weakness after normal activity. The white part of the eye turns yellow (jaundice). You have a decrease in the amount of urine or are urinating less often. Your urine turns a dark color or changes to pink, red, or brown. Document Released: 12/05/2000 Document Revised: 03/01/2012 Document Reviewed: 07/24/2008 [facility] Patient Information 2014 [employer], [place].  _______________________________________________________________________  ________________________________________________________________________

## 2021-07-01 NOTE — Progress Notes (Addendum)
COVID Vaccine Completed: x3 Date COVID Vaccine completed: 03/16/20, 04/06/20 Has received booster: 12/11/20 COVID vaccine manufacturer: Pfizer     Date of COVID positive in last 90 days: N/A  PCP -  Brayton Caves, MD Cardiologist - N/A  Chest x-ray - N/A EKG - N/A Stress Test - N/A ECHO - N/a Cardiac Cath - N/A Pacemaker/ICD device last checked: N/A Spinal Cord Stimulator: N/A  Sleep Study - N/A CPAP -   Fasting Blood Sugar - N/A Checks Blood Sugar _____ times a day  Blood Thinner Instructions: N/A Aspirin Instructions: Last Dose:  Activity level: Can go up a flight of stairs and perform activities of daily living without stopping and without symptoms of chest pain or shortness of breath. Able to exercise without symptoms      Anesthesia review: N/A  Patient denies shortness of breath, fever, cough and chest pain at PAT appointment   Patient verbalized understanding of instructions that were given to them at the PAT appointment. Patient was also instructed that they will need to review over the PAT instructions again at home before surgery.

## 2021-07-02 ENCOUNTER — Other Ambulatory Visit (HOSPITAL_COMMUNITY)
Admission: RE | Admit: 2021-07-02 | Discharge: 2021-07-02 | Disposition: A | Discharge status: Home / Self Care | Payer: Managed Care, Other (non HMO) | Source: Ambulatory Visit | Attending: General Surgery | Admitting: General Surgery

## 2021-07-02 ENCOUNTER — Other Ambulatory Visit: Payer: Self-pay

## 2021-07-02 ENCOUNTER — Encounter (HOSPITAL_COMMUNITY): Payer: Self-pay

## 2021-07-02 ENCOUNTER — Encounter (HOSPITAL_COMMUNITY)
Admission: RE | Admit: 2021-07-02 | Discharge: 2021-07-02 | Disposition: A | Discharge status: Home / Self Care | Payer: Managed Care, Other (non HMO) | Source: Ambulatory Visit | Attending: General Surgery | Admitting: General Surgery

## 2021-07-02 ENCOUNTER — Other Ambulatory Visit (HOSPITAL_COMMUNITY): Payer: Managed Care, Other (non HMO)

## 2021-07-02 DIAGNOSIS — Z20822 Contact with and (suspected) exposure to covid-19: Secondary | ICD-10-CM | POA: Insufficient documentation

## 2021-07-02 DIAGNOSIS — Z01812 Encounter for preprocedural laboratory examination: Secondary | ICD-10-CM | POA: Diagnosis not present

## 2021-07-02 HISTORY — DX: Family history of other specified conditions: Z84.89

## 2021-07-02 LAB — CBC WITH DIFFERENTIAL/PLATELET
Abs Immature Granulocytes: 0.01 10*3/uL (ref 0.00–0.07)
Basophils Absolute: 0 10*3/uL (ref 0.0–0.1)
Basophils Relative: 1 %
Eosinophils Absolute: 0 10*3/uL (ref 0.0–0.5)
Eosinophils Relative: 1 %
HCT: 39.9 % (ref 39.0–52.0)
Hemoglobin: 13.4 g/dL (ref 13.0–17.0)
Immature Granulocytes: 0 %
Lymphocytes Relative: 12 %
Lymphs Abs: 0.5 10*3/uL — ABNORMAL LOW (ref 0.7–4.0)
MCH: 29.6 pg (ref 26.0–34.0)
MCHC: 33.6 g/dL (ref 30.0–36.0)
MCV: 88.1 fL (ref 80.0–100.0)
Monocytes Absolute: 0.5 10*3/uL (ref 0.1–1.0)
Monocytes Relative: 11 %
Neutro Abs: 3.2 10*3/uL (ref 1.7–7.7)
Neutrophils Relative %: 75 %
Platelets: 181 10*3/uL (ref 150–400)
RBC: 4.53 MIL/uL (ref 4.22–5.81)
RDW: 13.5 % (ref 11.5–15.5)
WBC: 4.2 10*3/uL (ref 4.0–10.5)
nRBC: 0 % (ref 0.0–0.2)

## 2021-07-02 LAB — COMPREHENSIVE METABOLIC PANEL
ALT: 17 U/L (ref 0–44)
AST: 24 U/L (ref 15–41)
Albumin: 4.8 g/dL (ref 3.5–5.0)
Alkaline Phosphatase: 42 U/L (ref 38–126)
Anion gap: 6 (ref 5–15)
BUN: 12 mg/dL (ref 6–20)
CO2: 28 mmol/L (ref 22–32)
Calcium: 9.6 mg/dL (ref 8.9–10.3)
Chloride: 106 mmol/L (ref 98–111)
Creatinine, Ser: 1.15 mg/dL (ref 0.61–1.24)
GFR, Estimated: >60 mL/min (ref >60)
Glucose, Bld: 88 mg/dL (ref 70–99)
Potassium: 4 mmol/L (ref 3.5–5.1)
Sodium: 140 mmol/L (ref 135–145)
Total Bilirubin: 1 mg/dL (ref 0.3–1.2)
Total Protein: 7.8 g/dL (ref 6.5–8.1)

## 2021-07-02 LAB — SARS CORONAVIRUS 2 (TAT 6-24 HRS): SARS Coronavirus 2: NEGATIVE

## 2021-07-03 ENCOUNTER — Ambulatory Visit: Payer: Self-pay | Admitting: General Surgery

## 2021-07-03 MED ORDER — BUPIVACAINE LIPOSOME 1.3 % IJ SUSP
20.0000 mL | Freq: Once | INTRAMUSCULAR | Status: DC
  Filled 2021-07-03: qty 20

## 2021-07-03 NOTE — Anesthesia Preprocedure Evaluation (Addendum)
Anesthesia Evaluation  Patient identified by MRN, date of birth, ID band Patient awake    Reviewed: Allergy & Precautions, NPO status , Patient's Chart, lab work & pertinent test results  Airway Mallampati: II  TM Distance: >3 FB Neck ROM: Full    Dental no notable dental hx. (+) Teeth Intact, Dental Advisory Given   Pulmonary neg pulmonary ROS, Patient abstained from smoking., former smoker,    Pulmonary exam normal breath sounds clear to auscultation       Cardiovascular Exercise Tolerance: Good negative cardio ROS Normal cardiovascular exam Rhythm:Regular Rate:Normal     Neuro/Psych negative neurological ROS  negative psych ROS   GI/Hepatic Neg liver ROS, hiatal hernia, GERD  ,  Endo/Other    Renal/GU Lab Results      Component                Value               Date                      CREATININE               1.15                07/02/2021                BUN                      12                  07/02/2021                NA                       140                 07/02/2021                K                        4.0                 07/02/2021                CL                       106                 07/02/2021                CO2                      28                  07/02/2021                Musculoskeletal negative musculoskeletal ROS (+)   Abdominal   Peds  Hematology negative hematology ROS (+) Lab Results      Component                Value               Date                      WBC  4.2                 07/02/2021                HGB                      13.4                07/02/2021                HCT                      39.9                07/02/2021                MCV                      88.1                07/02/2021                PLT                      181                 07/02/2021              Anesthesia Other Findings   Reproductive/Obstetrics                             Anesthesia Physical Anesthesia Plan  ASA: 1  Anesthesia Plan: General   Post-op Pain Management:    Induction: Intravenous  PONV Risk Score and Plan: Treatment may vary due to age or medical condition, Ondansetron, Dexamethasone and Midazolam  Airway Management Planned: Oral ETT  Additional Equipment: None  Intra-op Plan:   Post-operative Plan:   Informed Consent: I have reviewed the patients History and Physical, chart, labs and discussed the procedure including the risks, benefits and alternatives for the proposed anesthesia with the patient or authorized representative who has indicated his/her understanding and acceptance.     Dental advisory given  Plan Discussed with: CRNA and Anesthesiologist  Anesthesia Plan Comments: (GA w lido infusion)       Anesthesia Quick Evaluation

## 2021-07-03 NOTE — H&P (Signed)
Joel Barry Appointment: 05/15/2021 11:30 AM Location: [facility] Patient #: [id number] DOB: [birth year] Married / Language: [place] / Race: White Male   History of Present Illness Joel Areola M. Joel Lehr MD; 05/16/2021 11:59 AM) The patient is a 45 year old male who presents with gastroesophageal reflux disease. he is referred by Dr Barron Alvine for evaluation of GERD/hiatal hernia. He has longstanding heartburn and GERD.  He states that his heartburn is currently generally well controlled with his current regimen which is Protonix.  However he has to be very careful with the timing of when he eats.  If he eats too late or eats too much or eats tomato-based foods he will wake up in all the night with bile in his throat and cough for few hours and then he states that he will have some difficulty breathing for a few days.  Initially this was happening about every 6 months but not as frequent now.  He travels a lot for work.  He does sleep elevated along with a wedge pillow.  He has an occasional sticking sensation with solids.  He no longer has heartburn pain like he did 3 years ago.  He states that again his heartburn is actually well controlled with his current regimen he is concerned about long-term implications of heartburn medication as well as the nighttime issues that he occasionally has.  He has biopsy-proven Barrett's esophagus without any signs of dysplasia or cancer.  He underwent a laparoscopic appendectomy back in 2010 but bounced back with ileus and was in the hospital for about a week.  He states that he was put on a medication for pain and it caused bleeding.  He believes it was Toradol.  Otherwise no prior abdominal surgeries.  He does vape with low-dose nicotine.  GERD history: -Index symptoms: Heartburn, regurgitation, nocturnal cough -Exacerbating features: roasted red peppers, eating close to bedtime, coffee -Medications trialed: Dexilant, Protonix -Current medications:  Protonix 40 mg/day -Complications: Barrett's Esophagus, hiatal hernia  GERD evaluation: -Last EGD: 08/2019 & 10/2020 - He also had a EGD in November 2021 with Dr. Barron Alvine.  Biopsies did confirm Barrett's without any signs of dysplasia, 4 cm hiatal hernia and a Hill grade 4 valve.  He had salmon-colored mucosa at 34 to 36 cm. -Barium esophagram: He had an upper GI in February of this year which I reviewed that showed small hiatal hernia and mild GERD -Esophageal Manometry: did not tolerate -pH/Impedance: None -Bravo: None  Endoscopic History: -egd- see above -EGD (Dr. 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 Danielle Dess, 09/16/2019): Medium sized HH, short segment Barrett's esophagus maximal extent 3 cm (path: Intestinal metaplasia without dysplasia), Patchy mild gastritis, normal duodenum   GERD-HRQL Questionnaire Score: 6/50 (on PPI).  Marked "dissatisfied" with current health condition.  High amounts of regurgitation on scoring (25/30 points)  Previously followed by Dr. Barnetta Chapel, MD at [facility], last seen 11/07/2019.  Plan at that time was for continue Dexilant 60 mg/day and Protonix 40 mg/day and follow-up in 1 year.   Problem List/Past Medical Joel Areola M. Andrey Campanile, MD; 05/16/2021 12:02 PM) Joel Barry ESOPHAGUS WITHOUT DYSPLASIA (K22.70)   HIATAL HERNIA WITH GERD (K44.9, K21.9)    Past Surgical History Joel Barry, CMA; 05/15/2021 10:56 AM) Appendectomy    Diagnostic Studies History (Joel Barry, CMA; 05/15/2021 10:56 AM) Colonoscopy   never  Allergies (Joel Barry, CMA; 05/15/2021  11:27 AM) No Known Drug Allergies   [05/15/2021]:  Medication History (Joel Barry, CMA; 05/15/2021 11:28 AM) Calcium  (75MG  Tablet, Oral) Active. Finasteride  (1MG  Tablet, Oral) Active. Multiple Vitamin  (Oral)  Active. Pantoprazole Sodium  (20MG  Tablet DR, Oral) Active. Medications Reconciled   Social History (Joel Barry, CMA; 05/15/2021 10:56 AM) Alcohol use   Moderate alcohol use. Caffeine use   Coffee. No drug use   Tobacco use   Former smoker.  Family History , CMA; 05/15/2021 10:56 AM) Cancer   Father.  Other Problems 05/17/2021 M. 05/17/2021, MD; 05/16/2021 12:02 PM) Back Pain   Gastroesophageal Reflux Disease      Review of Systems M M. Albert Devaul MD; 05/16/2021 11:54 AM) General Not Present- Appetite Loss, Chills, Fatigue, Fever, Night Sweats, Weight Gain and Weight Loss. Skin Not Present- Change in Wart/Mole, Dryness, Hives, Jaundice, New Lesions, Non-Healing Wounds, Rash and Ulcer. HEENT Present- Wears glasses/contact lenses. Not Present- Earache, Hearing Loss, Hoarseness, Nose Bleed, Oral Ulcers, Ringing in the Ears, Seasonal Allergies, Sinus Pain, Sore Throat, Visual Disturbances and Yellow Eyes. Respiratory Not Present- Bloody sputum, Chronic Cough, Difficulty Breathing, Snoring and Wheezing. Breast Not Present- Breast Mass, Breast Pain, Nipple Discharge and Skin Changes. Cardiovascular Not Present- Chest Pain, Difficulty Breathing Lying Down, Leg Cramps, Palpitations, Rapid Heart Rate, Shortness of Breath and Swelling of Extremities. Gastrointestinal Present- Abdominal Pain, Bloating and Vomiting. Not Present- Bloody Stool, Change in Bowel Habits, Chronic diarrhea, Constipation, Difficulty Swallowing, Excessive gas, Gets full quickly at meals, Hemorrhoids, Indigestion, Nausea and Rectal Pain. Male Genitourinary Not Present- Blood in Urine, Change in Urinary Stream, Frequency, Impotence, Nocturia, Painful Urination, Urgency and Urine Leakage. Musculoskeletal Present- Back Pain. Not Present- Joint Pain, Joint Stiffness, Muscle Pain, Muscle Weakness and Swelling of Extremities. Neurological Not Present- Decreased Memory, Fainting, Headaches, Numbness, Seizures, Tingling, Tremor,  Trouble walking and Weakness. Psychiatric Not Present- Anxiety, Bipolar, Change in Sleep Pattern, Depression, Fearful and Frequent crying. Endocrine Not Present- Cold Intolerance, Excessive Hunger, Hair Changes, Heat Intolerance, Hot flashes and New Diabetes. Hematology Not Present- Blood Thinners, Easy Bruising, Excessive bleeding, Gland problems, HIV and Persistent Infections.  Vitals (Joel Barry CMA; 05/15/2021 11:26 AM) 05/15/2021 11:26 AM Weight: 188.25 lb   Height: 72 in  Body Surface Area: 2.08 m   Body Mass Index: 25.53 kg/m   Temp.: 98 F    Pulse: 62 (Regular)    P.OX: 98% (Room air) BP: 120/70(Sitting, Left Arm, Standard)       Physical Exam 05/18/2021 M. Nasif Bos MD; 05/16/2021 11:53 AM) General Mental Status - Alert. General Appearance - Consistent with stated age. Hydration - Well hydrated. Voice - Normal.  Head and Neck Head - normocephalic, atraumatic with no lesions or palpable masses. Trachea - midline. Thyroid Gland Characteristics - normal size and consistency.  Eye Eyeball - Bilateral - Extraocular movements intact. Sclera/Conjunctiva - Bilateral - No scleral icterus.  Chest and Lung Exam Chest and lung exam reveals  - quiet, even and easy respiratory effort with no use of accessory muscles and on auscultation, normal breath sounds, no adventitious sounds and normal vocal resonance. Inspection Chest Wall - Normal. Back - normal.  Breast - Did not examine.  Cardiovascular Cardiovascular examination reveals  - normal heart sounds, regular rate and rhythm with no murmurs and normal pedal pulses bilaterally.  Abdomen Inspection Inspection of the abdomen reveals - No Hernias. Skin - Scar - no surgical scars. Palpation/Percussion Palpation and Percussion of the abdomen reveal - Soft, Non Tender, No Rebound tenderness, No  Rigidity (guarding) and No hepatosplenomegaly. Auscultation Auscultation of the abdomen reveals - Bowel sounds normal.  Peripheral  Vascular Upper Extremity Palpation - Pulses bilaterally normal.  Neurologic Neurologic evaluation reveals  - alert and oriented x 3 with no impairment of recent or remote memory. Mental Status - Normal.  Neuropsychiatric The patient's mood and affect are described as  - normal. Judgment and Insight - insight is appropriate concerning matters relevant to self.  Musculoskeletal Normal Exam - Left - Upper Extremity Strength Normal and Lower Extremity Strength Normal. Normal Exam - Right - Upper Extremity Strength Normal and Lower Extremity Strength Normal.  Lymphatic Head & Neck  General Head & Neck Lymphatics: Bilateral - Description - Normal. Axillary - Did not examine. Femoral & Inguinal - Did not examine.    Assessment & Plan Joel Areola M. Willisha Sligar MD; 05/16/2021 12:02 PM) HIATAL HERNIA WITH GERD (K44.9) Impression: He has hiatal hernia with GERD. He is fairly well controlled on his current regimen which is a strong indication that he would do well with antireflux surgery/procedure. He is interested in concomitant TIF procedure with hiatal hernia repair. He was given Agricultural engineer. We discussed laparoscopic hiatal hernia repair. I discussed the anatomy. I discussed the steps of the procedure which would be gaining access to the abdomen, reducing the herniated stomach, mobilizing hernia sac if present, reapproximating the diaphragm defect. We discussed Dr. Frankey Shown portions of the procedure. We discussed the typical hospitalization and recovery. We discussed the diet transition. We discussed the typical issues that can occur such as postoperative dysphagia, gas bloat, gas pain, occasional sticky sensation as diet is progressed, need for ongoing heartburn medications. We did discuss that he would need surveillance endoscopy because of his known Barrett's esophagus which Dr. Barron Alvine discussed. We also discussed specific risk and benefits of the laparoscopic hiatal hernia part of the  procedure including but not limited to bleeding, infection, injury to surrounding structures such as intestine, spleen, liver, esophagus, lungs or aorta. We discussed risk of perioperative cardiac and pulmonary events blood clots. We discussed hiatal hernia recurrence. We discussed a slipped wrap. We discussed gas bloat. All of his questions were asked and answered. He would like to proceed with repair in the summer.  This patient encounter took 45 minutes on day of visit to perform the following: take history, perform exam, review outside records, interpret imaging, counsel the patient on their diagnosis Current Plans Pt Education - Pamphlet Given - Gastroesophagel Reflux Disease: discussed with patient and provided information. You are being scheduled for surgery - Our schedulers will call you.   You should hear from our office's scheduling department within 5 working days about the location, date, and time of surgery.  We try to make accommodations for patient's preferences in scheduling surgery, but sometimes the OR schedule or the surgeon's schedule prevents [place] from making those accommodations.  If you have not heard from our office [phone]) in 5 working days, call the office and ask for your surgeon's nurse.  If you have other questions about your diagnosis, plan, or surgery, call the office and ask for your surgeon's nurse.  BARRETT'S ESOPHAGUS WITHOUT DYSPLASIA (K22.70)  Mary Sella. Andrey Campanile, MD, FACS General, Bariatric, & Minimally Invasive Surgery [facility], [place]

## 2021-07-04 ENCOUNTER — Ambulatory Visit (HOSPITAL_COMMUNITY): Payer: Managed Care, Other (non HMO) | Admitting: Anesthesiology

## 2021-07-04 ENCOUNTER — Encounter (HOSPITAL_COMMUNITY): Payer: Self-pay | Admitting: General Surgery

## 2021-07-04 ENCOUNTER — Other Ambulatory Visit: Payer: Self-pay

## 2021-07-04 ENCOUNTER — Encounter (HOSPITAL_COMMUNITY)
Admission: RE | Disposition: A | Discharge status: Home / Self Care | Payer: Self-pay | Source: Ambulatory Visit | Attending: Gastroenterology

## 2021-07-04 ENCOUNTER — Observation Stay (HOSPITAL_COMMUNITY)
Admission: RE | Admit: 2021-07-04 | Discharge: 2021-07-05 | Disposition: A | Discharge status: Home / Self Care | Payer: Managed Care, Other (non HMO) | Source: Ambulatory Visit | Attending: Gastroenterology | Admitting: Gastroenterology

## 2021-07-04 DIAGNOSIS — K2270 Barrett's esophagus without dysplasia: Secondary | ICD-10-CM | POA: Diagnosis not present

## 2021-07-04 DIAGNOSIS — Z87891 Personal history of nicotine dependence: Secondary | ICD-10-CM | POA: Diagnosis not present

## 2021-07-04 DIAGNOSIS — K449 Diaphragmatic hernia without obstruction or gangrene: Secondary | ICD-10-CM | POA: Diagnosis present

## 2021-07-04 DIAGNOSIS — K219 Gastro-esophageal reflux disease without esophagitis: Secondary | ICD-10-CM | POA: Diagnosis not present

## 2021-07-04 HISTORY — PX: HIATAL HERNIA REPAIR: SHX195

## 2021-07-04 HISTORY — PX: TRANSORAL INCISIONLESS FUNDOPLICATION: SHX6840

## 2021-07-04 HISTORY — PX: ESOPHAGOGASTRODUODENOSCOPY: SHX5428

## 2021-07-04 LAB — TYPE AND SCREEN
ABO/RH(D): O NEG
Antibody Screen: NEGATIVE

## 2021-07-04 LAB — ABO/RH: ABO/RH(D): O NEG

## 2021-07-04 SURGERY — REPAIR, HERNIA, HIATAL, LAPAROSCOPIC
Anesthesia: General | Site: Esophagus

## 2021-07-04 MED ORDER — LACTATED RINGERS IV SOLN: INTRAVENOUS | Status: DC

## 2021-07-04 MED ORDER — SIMETHICONE 80 MG PO CHEW
80.0000 mg | CHEWABLE_TABLET | Freq: Four times a day (QID) | ORAL | Status: DC | PRN
  Administered 2021-07-04: 80 mg via ORAL
  Filled 2021-07-04: qty 1

## 2021-07-04 MED ORDER — OXYCODONE HCL 5 MG/5ML PO SOLN: 5.0000 mg | Freq: Once | ORAL | Status: AC | PRN

## 2021-07-04 MED ORDER — LACTATED RINGERS IR SOLN
Status: DC | PRN
  Administered 2021-07-04: 1000 mL

## 2021-07-04 MED ORDER — MIDAZOLAM HCL 5 MG/5ML IJ SOLN
INTRAMUSCULAR | Status: DC | PRN
  Administered 2021-07-04: 2 mg via INTRAVENOUS

## 2021-07-04 MED ORDER — CHLORHEXIDINE GLUCONATE CLOTH 2 % EX PADS: 6.0000 | MEDICATED_PAD | Freq: Once | CUTANEOUS | Status: DC

## 2021-07-04 MED ORDER — ONDANSETRON HCL 4 MG/2ML IJ SOLN
INTRAMUSCULAR | Status: AC
  Filled 2021-07-04: qty 2

## 2021-07-04 MED ORDER — SODIUM CHLORIDE 0.9 % IV SOLN
2.0000 g | INTRAVENOUS | Status: AC
  Administered 2021-07-04: 2 g via INTRAVENOUS
  Filled 2021-07-04: qty 2

## 2021-07-04 MED ORDER — LIDOCAINE 2% (20 MG/ML) 5 ML SYRINGE
INTRAMUSCULAR | Status: AC
  Filled 2021-07-04: qty 5

## 2021-07-04 MED ORDER — BUPIVACAINE-EPINEPHRINE (PF) 0.25% -1:200000 IJ SOLN
INTRAMUSCULAR | Status: AC
  Filled 2021-07-04: qty 30

## 2021-07-04 MED ORDER — SODIUM CHLORIDE 0.9 % IV SOLN: INTRAVENOUS | Status: DC

## 2021-07-04 MED ORDER — EPHEDRINE SULFATE-NACL 50-0.9 MG/10ML-% IV SOSY
PREFILLED_SYRINGE | INTRAVENOUS | Status: DC | PRN
  Administered 2021-07-04 (×3): 10 mg via INTRAVENOUS

## 2021-07-04 MED ORDER — FENTANYL CITRATE (PF) 250 MCG/5ML IJ SOLN
INTRAMUSCULAR | Status: DC | PRN
  Administered 2021-07-04: 100 ug via INTRAVENOUS
  Administered 2021-07-04: 50 ug via INTRAVENOUS

## 2021-07-04 MED ORDER — ORAL CARE MOUTH RINSE: 15.0000 mL | Freq: Once | OROMUCOSAL | Status: AC

## 2021-07-04 MED ORDER — CHLORHEXIDINE GLUCONATE 0.12 % MT SOLN
15.0000 mL | Freq: Once | OROMUCOSAL | Status: AC
  Administered 2021-07-04: 15 mL via OROMUCOSAL

## 2021-07-04 MED ORDER — KETAMINE HCL 10 MG/ML IJ SOLN
INTRAMUSCULAR | Status: AC
  Filled 2021-07-04: qty 1

## 2021-07-04 MED ORDER — KETAMINE HCL 10 MG/ML IJ SOLN
INTRAMUSCULAR | Status: DC | PRN
  Administered 2021-07-04: 38 mg via INTRAVENOUS

## 2021-07-04 MED ORDER — DEXAMETHASONE SODIUM PHOSPHATE 10 MG/ML IJ SOLN
8.0000 mg | Freq: Four times a day (QID) | INTRAMUSCULAR | Status: DC
  Administered 2021-07-04 – 2021-07-05 (×3): 8 mg via INTRAVENOUS
  Filled 2021-07-04 (×3): qty 1

## 2021-07-04 MED ORDER — PROPOFOL 10 MG/ML IV BOLUS
INTRAVENOUS | Status: AC
  Filled 2021-07-04: qty 20

## 2021-07-04 MED ORDER — LIDOCAINE 2% (20 MG/ML) 5 ML SYRINGE
INTRAMUSCULAR | Status: DC | PRN
  Administered 2021-07-04: 100 mg via INTRAVENOUS

## 2021-07-04 MED ORDER — HEPARIN SODIUM (PORCINE) 5000 UNIT/ML IJ SOLN
5000.0000 [IU] | Freq: Once | INTRAMUSCULAR | Status: AC
  Administered 2021-07-04: 5000 [IU] via SUBCUTANEOUS
  Filled 2021-07-04: qty 1

## 2021-07-04 MED ORDER — DEXAMETHASONE SODIUM PHOSPHATE 4 MG/ML IJ SOLN
INTRAMUSCULAR | Status: DC | PRN
  Administered 2021-07-04: 8 mg via INTRAVENOUS

## 2021-07-04 MED ORDER — PROPOFOL 10 MG/ML IV BOLUS
INTRAVENOUS | Status: DC | PRN
  Administered 2021-07-04: 160 mg via INTRAVENOUS

## 2021-07-04 MED ORDER — FENTANYL CITRATE (PF) 250 MCG/5ML IJ SOLN
INTRAMUSCULAR | Status: AC
  Filled 2021-07-04: qty 5

## 2021-07-04 MED ORDER — LIDOCAINE HCL 2 % IJ SOLN
INTRAMUSCULAR | Status: AC
  Filled 2021-07-04: qty 20

## 2021-07-04 MED ORDER — SODIUM CHLORIDE 0.9 % IV SOLN: 2.0000 g | Freq: Once | INTRAVENOUS | Status: DC

## 2021-07-04 MED ORDER — ACETAMINOPHEN 10 MG/ML IV SOLN: 1000.0000 mg | Freq: Once | INTRAVENOUS | Status: DC | PRN

## 2021-07-04 MED ORDER — GABAPENTIN 300 MG PO CAPS
300.0000 mg | ORAL_CAPSULE | ORAL | Status: AC
  Administered 2021-07-04: 300 mg via ORAL
  Filled 2021-07-04: qty 1

## 2021-07-04 MED ORDER — OXYCODONE HCL 5 MG PO TABS
5.0000 mg | ORAL_TABLET | Freq: Once | ORAL | Status: AC | PRN
Start: 2021-07-04 — End: 2021-07-04
  Administered 2021-07-04: 5 mg via ORAL

## 2021-07-04 MED ORDER — FAMOTIDINE IN NACL 20-0.9 MG/50ML-% IV SOLN
20.0000 mg | Freq: Once | INTRAVENOUS | Status: DC
  Filled 2021-07-04: qty 50

## 2021-07-04 MED ORDER — SUGAMMADEX SODIUM 200 MG/2ML IV SOLN
INTRAVENOUS | Status: DC | PRN
  Administered 2021-07-04: 200 mg via INTRAVENOUS

## 2021-07-04 MED ORDER — PHENYLEPHRINE 40 MCG/ML (10ML) SYRINGE FOR IV PUSH (FOR BLOOD PRESSURE SUPPORT)
PREFILLED_SYRINGE | INTRAVENOUS | Status: AC
  Filled 2021-07-04: qty 10

## 2021-07-04 MED ORDER — ONDANSETRON HCL 4 MG/2ML IJ SOLN: 4.0000 mg | Freq: Once | INTRAMUSCULAR | Status: DC | PRN

## 2021-07-04 MED ORDER — AMISULPRIDE (ANTIEMETIC) 5 MG/2ML IV SOLN: 10.0000 mg | Freq: Once | INTRAVENOUS | Status: DC | PRN

## 2021-07-04 MED ORDER — OXYCODONE HCL 5 MG PO TABS
ORAL_TABLET | ORAL | Status: AC
  Filled 2021-07-04: qty 1

## 2021-07-04 MED ORDER — LIDOCAINE VISCOUS HCL 2 % MT SOLN
5.0000 mL | Freq: Three times a day (TID) | OROMUCOSAL | Status: DC | PRN
  Administered 2021-07-04: 5 mL via OROMUCOSAL
  Filled 2021-07-04 (×3): qty 15

## 2021-07-04 MED ORDER — SCOPOLAMINE 1 MG/3DAYS TD PT72
1.0000 | MEDICATED_PATCH | Freq: Once | TRANSDERMAL | Status: DC
Start: 2021-07-04 — End: 2021-07-04

## 2021-07-04 MED ORDER — ONDANSETRON HCL 4 MG/2ML IJ SOLN
4.0000 mg | Freq: Four times a day (QID) | INTRAMUSCULAR | Status: DC
  Administered 2021-07-04 – 2021-07-05 (×3): 4 mg via INTRAVENOUS
  Filled 2021-07-04 (×3): qty 2

## 2021-07-04 MED ORDER — BUPIVACAINE-EPINEPHRINE 0.25% -1:200000 IJ SOLN
INTRAMUSCULAR | Status: DC | PRN
Start: 2021-07-04 — End: 2021-07-04
  Administered 2021-07-04: 30 mL

## 2021-07-04 MED ORDER — LIDOCAINE 2% (20 MG/ML) 5 ML SYRINGE
INTRAMUSCULAR | Status: DC | PRN
  Administered 2021-07-04: 1.5 mg/kg/h via INTRAVENOUS

## 2021-07-04 MED ORDER — ACETAMINOPHEN-CODEINE 120-12 MG/5ML PO SOLN: 15.0000 mL | ORAL | Status: DC | PRN

## 2021-07-04 MED ORDER — SODIUM CHLORIDE 0.9 % IV SOLN
INTRAVENOUS | Status: DC
Start: 2021-07-04 — End: 2021-07-04

## 2021-07-04 MED ORDER — DEXAMETHASONE SODIUM PHOSPHATE 10 MG/ML IJ SOLN
INTRAMUSCULAR | Status: AC
  Filled 2021-07-04: qty 1

## 2021-07-04 MED ORDER — DEXMEDETOMIDINE (PRECEDEX) IN NS 20 MCG/5ML (4 MCG/ML) IV SYRINGE
PREFILLED_SYRINGE | INTRAVENOUS | Status: DC | PRN
  Administered 2021-07-04 (×2): 16 ug via INTRAVENOUS

## 2021-07-04 MED ORDER — BUPIVACAINE LIPOSOME 1.3 % IJ SUSP
INTRAMUSCULAR | Status: DC | PRN
  Administered 2021-07-04: 20 mL

## 2021-07-04 MED ORDER — ENSURE PRE-SURGERY PO LIQD
296.0000 mL | Freq: Once | ORAL | Status: DC
  Filled 2021-07-04: qty 296

## 2021-07-04 MED ORDER — DEXMEDETOMIDINE HCL IN NACL 200 MCG/50ML IV SOLN
INTRAVENOUS | Status: AC
  Filled 2021-07-04: qty 50

## 2021-07-04 MED ORDER — DEXAMETHASONE SODIUM PHOSPHATE 4 MG/ML IJ SOLN: 4.0000 mg | INTRAMUSCULAR | Status: DC

## 2021-07-04 MED ORDER — METOCLOPRAMIDE HCL 5 MG/ML IJ SOLN
10.0000 mg | Freq: Four times a day (QID) | INTRAMUSCULAR | Status: DC
  Administered 2021-07-04 – 2021-07-05 (×3): 10 mg via INTRAVENOUS
  Filled 2021-07-04 (×3): qty 2

## 2021-07-04 MED ORDER — SODIUM CHLORIDE 0.9 % IR SOLN
Status: DC | PRN
Start: 2021-07-04 — End: 2021-07-04
  Administered 2021-07-04: 1000 mL

## 2021-07-04 MED ORDER — ACETAMINOPHEN-CODEINE #3 300-30 MG PO TABS
1.0000 | ORAL_TABLET | ORAL | Status: DC | PRN
  Administered 2021-07-04 – 2021-07-05 (×2): 1 via ORAL
  Filled 2021-07-04 (×2): qty 1

## 2021-07-04 MED ORDER — MIDAZOLAM HCL 2 MG/2ML IJ SOLN
INTRAMUSCULAR | Status: AC
  Filled 2021-07-04: qty 2

## 2021-07-04 MED ORDER — HYDROMORPHONE HCL 1 MG/ML IJ SOLN: 0.2500 mg | INTRAMUSCULAR | Status: DC | PRN

## 2021-07-04 MED ORDER — SCOPOLAMINE 1 MG/3DAYS TD PT72
1.0000 | MEDICATED_PATCH | TRANSDERMAL | Status: DC
  Administered 2021-07-04: 1.5 mg via TRANSDERMAL
  Filled 2021-07-04: qty 1

## 2021-07-04 MED ORDER — PANTOPRAZOLE SODIUM 40 MG PO TBEC
40.0000 mg | DELAYED_RELEASE_TABLET | Freq: Two times a day (BID) | ORAL | Status: DC
  Administered 2021-07-04 – 2021-07-05 (×2): 40 mg via ORAL
  Filled 2021-07-04 (×2): qty 1

## 2021-07-04 MED ORDER — ROCURONIUM BROMIDE 10 MG/ML (PF) SYRINGE
PREFILLED_SYRINGE | INTRAVENOUS | Status: AC
  Filled 2021-07-04: qty 10

## 2021-07-04 MED ORDER — ACETAMINOPHEN 500 MG PO TABS
1000.0000 mg | ORAL_TABLET | ORAL | Status: AC
  Administered 2021-07-04: 1000 mg via ORAL
  Filled 2021-07-04: qty 2

## 2021-07-04 MED ORDER — ONDANSETRON HCL 4 MG/2ML IJ SOLN
INTRAMUSCULAR | Status: DC | PRN
  Administered 2021-07-04: 4 mg via INTRAVENOUS

## 2021-07-04 MED ORDER — ONDANSETRON HCL 4 MG/2ML IJ SOLN: 4.0000 mg | Freq: Once | INTRAMUSCULAR | Status: DC

## 2021-07-04 MED ORDER — ROCURONIUM BROMIDE 10 MG/ML (PF) SYRINGE
PREFILLED_SYRINGE | INTRAVENOUS | Status: DC | PRN
  Administered 2021-07-04: 20 mg via INTRAVENOUS
  Administered 2021-07-04: 30 mg via INTRAVENOUS
  Administered 2021-07-04: 20 mg via INTRAVENOUS
  Administered 2021-07-04: 60 mg via INTRAVENOUS
  Administered 2021-07-04: 10 mg via INTRAVENOUS

## 2021-07-04 SURGICAL SUPPLY — 50 items
APPLIER CLIP ROT 10 11.4 M/L (STAPLE)
BAG COUNTER SPONGE SURGICOUNT (BAG) IMPLANT
BENZOIN TINCTURE PRP APPL 2/3 (GAUZE/BANDAGES/DRESSINGS) IMPLANT
CABLE HIGH FREQUENCY MONO STRZ (ELECTRODE) IMPLANT
CHLORAPREP W/TINT 26 (MISCELLANEOUS) ×3 IMPLANT
CLIP APPLIE ROT 10 11.4 M/L (STAPLE) IMPLANT
DECANTER SPIKE VIAL GLASS SM (MISCELLANEOUS) IMPLANT
DEVICE SUT QUICK LOAD TK 5 (STAPLE) ×12 IMPLANT
DEVICE SUT TI-KNOT TK 5X26 (MISCELLANEOUS) ×3 IMPLANT
DEVICE SUTURE ENDOST 10MM (ENDOMECHANICALS) ×3 IMPLANT
DISSECTOR BLUNT TIP ENDO 5MM (MISCELLANEOUS) ×3 IMPLANT
DRAIN PENROSE 0.5X18 (DRAIN) ×3 IMPLANT
DRSG TEGADERM 2-3/8X2-3/4 SM (GAUZE/BANDAGES/DRESSINGS) ×3 IMPLANT
ELECT L-HOOK LAP 45CM DISP (ELECTROSURGICAL)
ELECT REM PT RETURN 15FT ADLT (MISCELLANEOUS) ×3 IMPLANT
ELECTRODE L-HOOK LAP 45CM DISP (ELECTROSURGICAL) IMPLANT
GAUZE SPONGE 2X2 8PLY STRL LF (GAUZE/BANDAGES/DRESSINGS) ×2 IMPLANT
GLOVE SURG POLY ORTHO LF SZ7.5 (GLOVE) ×3 IMPLANT
GLOVE SURG UNDER POLY LF SZ7 (GLOVE) IMPLANT
GOWN STRL REUS W/TWL LRG LVL3 (GOWN DISPOSABLE) ×3 IMPLANT
GOWN STRL REUS W/TWL XL LVL3 (GOWN DISPOSABLE) ×9 IMPLANT
GRASPER SUT TROCAR 14GX15 (MISCELLANEOUS) ×3 IMPLANT
KIT BASIN OR (CUSTOM PROCEDURE TRAY) ×3 IMPLANT
KIT ESOPHYX Z+ (Miscellaneous) ×3 IMPLANT
KIT TURNOVER KIT A (KITS) ×3 IMPLANT
NS IRRIG 1000ML POUR BTL (IV SOLUTION) ×3 IMPLANT
PACK UNIVERSAL I (CUSTOM PROCEDURE TRAY) ×3 IMPLANT
PENCIL SMOKE EVACUATOR (MISCELLANEOUS) IMPLANT
SCISSORS LAP 5X45 EPIX DISP (ENDOMECHANICALS) ×3 IMPLANT
SET IRRIG TUBING LAPAROSCOPIC (IRRIGATION / IRRIGATOR) ×3 IMPLANT
SET TUBE SMOKE EVAC HIGH FLOW (TUBING) ×3 IMPLANT
SHEARS HARMONIC ACE PLUS 45CM (MISCELLANEOUS) ×3 IMPLANT
SLEEVE XCEL OPT CAN 5 100 (ENDOMECHANICALS) ×9 IMPLANT
SPONGE GAUZE 2X2 STER 10/PKG (GAUZE/BANDAGES/DRESSINGS) ×1
STRIP CLOSURE SKIN 1/2X4 (GAUZE/BANDAGES/DRESSINGS) ×3 IMPLANT
SUT ETHIBOND 2 0 SH (SUTURE) ×3
SUT ETHIBOND 2 0 SH 36X2 (SUTURE) ×6 IMPLANT
SUT MNCRL AB 4-0 PS2 18 (SUTURE) ×3 IMPLANT
SUT SURGIDAC NAB ES-9 0 48 120 (SUTURE) ×9 IMPLANT
TIP INNERVISION DETACH 40FR (MISCELLANEOUS) IMPLANT
TIP INNERVISION DETACH 50FR (MISCELLANEOUS) IMPLANT
TIP INNERVISION DETACH 56FR (MISCELLANEOUS) IMPLANT
TIPS INNERVISION DETACH 40FR (MISCELLANEOUS)
TOWEL OR 17X26 10 PK STRL BLUE (TOWEL DISPOSABLE) ×3 IMPLANT
TOWEL OR NON WOVEN STRL DISP B (DISPOSABLE) IMPLANT
TRAY FOLEY MTR SLVR 16FR STAT (SET/KITS/TRAYS/PACK) ×3 IMPLANT
TRAY LAPAROSCOPIC (CUSTOM PROCEDURE TRAY) ×3 IMPLANT
TROCAR BLADELESS OPT 5 100 (ENDOMECHANICALS) ×3 IMPLANT
TROCAR XCEL BLUNT TIP 100MML (ENDOMECHANICALS) IMPLANT
TROCAR XCEL NON-BLD 11X100MML (ENDOMECHANICALS) ×3 IMPLANT

## 2021-07-04 NOTE — Interval H&P Note (Signed)
History and Physical Interval Note:  07/04/2021 10:55 AM  Joel Barry  has presented today for surgery, with the diagnosis of GERD HIATAL HERNIA.  The various methods of treatment have been discussed with the patient and family. After consideration of risks, benefits and other options for treatment, the patient has consented to  Procedure(s): LAPAROSCOPIC REPAIR OF HIATAL HERNIA (N/A) ESOPHAGOGASTRODUODENOSCOPY (EGD) (N/A) TRANSORAL INCISIONLESS FUNDOPLICATION (N/A) as a surgical intervention.  The patient's history has been reviewed, patient examined, no change in status, stable for surgery.  I have reviewed the patient's chart and labs.  Questions were answered to the patient's satisfaction.     [name]

## 2021-07-04 NOTE — Op Note (Signed)
07/04/2021  2:16 PM  PATIENT:  Joel Barry  45 y.o. male  PRE-OPERATIVE DIAGNOSIS:  GERD WITH BARRETT'S; HIATAL HERNIA  POST-OPERATIVE DIAGNOSIS:  same  PROCEDURE:  Procedure(s): LAPAROSCOPIC REPAIR OF HIATAL HERNIA  Laparoscopic bilateral tap block TRANSORAL INCISIONLESS FUNDOPLICATION - Dr Barron Alvine ESOPHAGOGASTRODUODENOSCOPY (EGD)  SURGEON:  Surgeon(s): Gaynelle Adu, MD  [name], DO  ASSISTANTS: Karie Soda, MD; Myrtie Soman RNFA  ANESTHESIA:   general  EBL: minimal   DRAINS: none   LOCAL MEDICATIONS USED:  MARCAINE    and OTHER Exparel  SPECIMEN:  Source of Specimen:  perigastric fat   DISPOSITION OF SPECIMEN:   discarded  COUNTS:  YES  INDICATION FOR PROCEDURE: Patient is a 45 year old male with a longstanding history of heartburn and GERD.  His heartburn is actually currently well controlled on Protonix however he has to follow good behavioral practices in order to minimize symptoms.  He does have endoscopic evidence of Barrett's esophagus without any signs of dysplasia or cancer.  He was referred because of evidence of a hiatal hernia on upper endoscopy in anticipation for a planned TIF procedure.  Patient was interested in proceeding with laparoscopic hiatal hernia repair with concomitant TIF procedure.  Risk and benefits were separately documented  PROCEDURE:  Patient was given oral Tylenol and gabapentin preoperatively along with Decadron.  He was also given 5000 units of subcutaneous heparin.  After consent was obtained he was taken to the OR 5 at [facility] long hospital placed supine on the operating room table.  General endotracheal anesthesia was obtained.  Sequential compression devices were placed.  His abdomen was prepped and draped in the usual standard surgical fashion with ChloraPrep.  IV antibiotic was administered.  Surgical timeout was performed.  Access to the abdomen was achieved in the left upper quadrant at Palmer's point using a  5 mm Optiview camera with a 5 mm trocar.  It was advanced under direct visualization into the abdominal cavity.  Pneumoperitoneum was smoothly established without any change in patient vital signs.  There is no evidence of injury to surrounding structures.  Patient was placed in reverse Trendelenburg.  A 5 mm trocar was placed slightly above into the left of the umbilicus.  A 5 mm trocar in the right lateral abdominal wall and an 11 mm trocar in the right midabdomen.  An additional 5 mm trocar was placed in the left midaxillary line and a 5 Miller trocar was placed in the subxiphoid position for the [facility] liver retractor.  The left lobe of the liver was lifted up to expose the hiatus.  A bilateral laparoscopic Exparel Marcaine tap block was performed under direct visualization for postoperative pain relief.   There was an obvious hiatal hernia.    The gastrohepatic ligament was incised with harmonic scalpel.  My assistant retracted the lesser curvature perigastric fat to patient's left side .  Right crus was identified.  I took down anterior attachments with harmonic scalpel.  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  Dissection continued superior to that.  The peritoneum directly over the right crus was identified and gently incised with harmonic scalpel.  Then using gentle blunt dissection between a Prestige grasper as well as a laparoscopic Kitner I started mobilizing along the right side in the mediastinum and along the esophagus and crossing anteriorly.  It was quite stuck anteriorly.  We took our time and use gentle atraumatic dissection of this area using Kitner  and procedure grasper and using harmonic scalpel.  I returned back to the right crus and identified the confluence of the left and right crus.  The posterior vagus nerve was identified.  We decided that we needed to take down some short gastrics in order to further mobilize the fundus off the  diaphragm and left crus.  My assistant held up some short gastrics and I incised right next along the greater curvature in the distal fundal area.  We then took down the short gastrics in sequential fashion using harmonic scalpel all the way up to the left crus.    A Penrose drain was placed around the distal esophagus in a retrogastric fashion in order for [place] to finish mobilizing the fundus off the left crus of the diaphragm.  This was done with harmonic scalpel.  At this point the fundus and distal esophagus was completely free of the hiatus and diaphragm.    We continued with a circumferential mobilization of the esophagus in the mediastinum.    Aorta was identified.  We were able to mobilize the esophagus all the way to the level of the pericardium.  At this point there was at least 2 cm of intra-abdominal esophageal length.   I then reapproximated the diaphragm and closed the left and right crura with 4 interrupted 0 Ethibond sutures using the Endo Stitch device each secured with a titanium tie knot with reduced pneumoperitoneum.  I was able to place a procedure grasper through the remaining defect in the hiatus above the esophagus.   The 11 mm trocar was removed and the fascial defect was reapproximated with a 0 Vicryl with a PMI suture passer and additional local was infiltrated.  The [facility] liver retractor was removed.  There was no evidence of bleeding from the subxiphoid trocar site.  Remaining trochars were removed.  Skin incisions were closed with a 4 Monocryl in a subcuticular fashion followed by the application of Steri-Strips and Tegaderms.  Patient was then repositioned for the TIF portion of the procedure.  I stayed in the room until Dr. Barron Alvine arrived.  I also watched him advance the endoscope down the patient's oropharynx into the distal esophagus and into the stomach.  There was chronic changes esophagitis and Barrett's in the distal esophagus.  There was no evidence of injury to the  esophagus.  The hiatal hernia repair was visualized on retroflexion of the scope and there was now a Hill grade 1 valve.  Please see his operative procedure note for further discussion regarding his portion of the procedure  PLAN OF CARE: Admit for overnight observation  PATIENT DISPOSITION:  PACU - hemodynamically stable.   Delay start of Pharmacological VTE agent (>24hrs) due to surgical blood loss or risk of bleeding:  no  Joel Barry. Andrey Campanile, MD, FACS General, Bariatric, & Minimally Invasive Surgery [facility], [place]

## 2021-07-04 NOTE — Progress Notes (Signed)
[facility]  Joel Barry is a 45 y.o. male with a long-standing hx of GERD since late 20's. +nocturnal sxs.  Can still have breakthrough symptoms despite HOB elevated, avoids eating close to bedtime. Travels for work frequently, which worsens sxs due to disruption of routines.  Feels that his reflux symptoms are worsening despite continued PPI and mitigation strategies. He presents today for concomitant hiatal hernia repair and TIF.    GERD history: -Index symptoms: Heartburn, regurgitation, nocturnal cough -Exacerbating features: roasted red peppers, eating close to bedtime, coffee -Medications trialed: Dexilant, Protonix -Current medications: Protonix 40 mg/day -Complications: Barrett's Esophagus, hiatal hernia   GERD evaluation: -Last EGD: 08/2019 -Barium esophagram: 01/2021: Mild gastroesophageal reflux, sliding HH, normal motility -Esophageal Manometry: None (unable to tolerate) -pH/Impedance: None (unable to tolerate) -Bravo: None   Endoscopic History: -EGD (Dr. 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 Barron Alvine, 12/04/2020): 4 cm HH, C2 M3 nondysplastic Barrett's Esophagus    GERD-HRQL Questionnaire Score: 6/50 (on PPI).  Marked "dissatisfied" with current health condition.  High amounts of regurgitation on scoring (25/30 points)   Previously followed by Dr. Barnetta Chapel, MD at [facility], last seen 11/07/2019.     Objective: Vital signs in last 24 hours: Temp:  [98.2 F (36.8 C)] 98.2 F (36.8 C) (07/14 0913) Pulse Rate:  [57] 57 (07/14 0913) Resp:  [18] 18 (07/14 0913) BP:  (125)/(85) 125/85 (07/14 0913) SpO2:  [100 %] 100 % (07/14 0913) Weight:  [81.6 kg] 81.6 kg (07/14 0911)   General: NAD Abdomen:  Soft, NT, ND, +BS Ext:  No c/c/e    Intake/Output from previous day: No intake/output data recorded. Intake/Output this shift: No intake/output data recorded.   Lab Results: Recent Labs    07/02/21 1130  WBC 4.2  HGB 13.4  PLT 181  MCV 88.1   BMET Recent Labs    07/02/21 1130  NA 140  K 4.0  CL 106  CO2 28  GLUCOSE 88  BUN 12  CREATININE 1.15  CALCIUM 9.6   LFT Recent Labs    07/02/21 1130  PROT 7.8  ALBUMIN 4.8  AST 24  ALT 17  ALKPHOS 42  BILITOT 1.0   PT/INR No results for input(s): INR in the last 72 hours.    Imaging/Other results: No results found.    Assessment and Plan:  1) GERD 2) Hiatal hernia 3) Short segment nondysplastic Barrett's Esophagus  - Plan for laparoscopic hiatal hernia repair and TIF today in the OR - Overnight admission for observation    [name], DO  07/04/2021, 10:49 AM [facility] Pager [phone]

## 2021-07-04 NOTE — Progress Notes (Signed)
Patient doing relatively well postoperatively.  Does have some sore throat, upper abdominal/lower sternal border tenderness, along with L>R shoulder pain.  Aside from expected postoperative tenderness in the abdomen, exam otherwise unremarkable, to include no crepitance.  VSS, AF. Ambulating around the ward without issue.  Tolerating ice chips.  - Clears okay overnight - Patient has pain medications ordered - Please use viscous lidocaine and simethicone as ordered prn

## 2021-07-04 NOTE — H&P (View-Only) (Signed)
[facility]  Joel Barry is a 45 y.o. male with a long-standing hx of GERD since late 20's. +nocturnal sxs.  Can still have breakthrough symptoms despite HOB elevated, avoids eating close to bedtime. Travels for work frequently, which worsens sxs due to disruption of routines.  Feels that his reflux symptoms are worsening despite continued PPI and mitigation strategies. He presents today for concomitant hiatal hernia repair and TIF.    GERD history: -Index symptoms: Heartburn, regurgitation, nocturnal cough -Exacerbating features: roasted red peppers, eating close to bedtime, coffee -Medications trialed: Dexilant, Protonix -Current medications: Protonix 40 mg/day -Complications: Barrett's Esophagus, hiatal hernia   GERD evaluation: -Last EGD: 08/2019 -Barium esophagram: 01/2021: Mild gastroesophageal reflux, sliding HH, normal motility -Esophageal Manometry: None (unable to tolerate) -pH/Impedance: None (unable to tolerate) -Bravo: None   Endoscopic History: -EGD (Dr. Pai, 08/06/2018): 2 tongues of salmon-colored mucosa with ulceration, maximal longitudinal span 2 cm (path: Intestinal metaplasia without dysplasia), Medium sized HH, patchy moderate gastritis, normal duodenum -EGD (Dr. Pai, 09/16/2019): Medium sized HH, short segment Barrett's esophagus maximal extent 3 cm (path: Intestinal metaplasia without dysplasia), Patchy mild gastritis, normal duodenum - EGD (Dr. Thanh Mottern, 12/04/2020): 4 cm HH, C2 M3 nondysplastic Barrett's Esophagus    GERD-HRQL Questionnaire Score: 6/50 (on PPI).  Marked "dissatisfied" with current health condition.  High amounts of regurgitation on scoring (25/30 points)   Previously followed by Dr. Jeevan Pai, MD at [facility], last seen 11/07/2019.     Objective: Vital signs in last 24 hours: Temp:  [98.2 F (36.8 C)] 98.2 F (36.8 C) (07/14 0913) Pulse Rate:  [57] 57 (07/14 0913) Resp:  [18] 18 (07/14 0913) BP:  (125)/(85) 125/85 (07/14 0913) SpO2:  [100 %] 100 % (07/14 0913) Weight:  [81.6 kg] 81.6 kg (07/14 0911)   General: NAD Abdomen:  Soft, NT, ND, +BS Ext:  No c/c/e    Intake/Output from previous day: No intake/output data recorded. Intake/Output this shift: No intake/output data recorded.   Lab Results: Recent Labs    07/02/21 1130  WBC 4.2  HGB 13.4  PLT 181  MCV 88.1   BMET Recent Labs    07/02/21 1130  NA 140  K 4.0  CL 106  CO2 28  GLUCOSE 88  BUN 12  CREATININE 1.15  CALCIUM 9.6   LFT Recent Labs    07/02/21 1130  PROT 7.8  ALBUMIN 4.8  AST 24  ALT 17  ALKPHOS 42  BILITOT 1.0   PT/INR No results for input(s): INR in the last 72 hours.    Imaging/Other results: No results found.    Assessment and Plan:  1) GERD 2) Hiatal hernia 3) Short segment nondysplastic Barrett's Esophagus  - Plan for laparoscopic hiatal hernia repair and TIF today in the OR - Overnight admission for observation    Joel Barry V Joel Eisenhuth, DO  07/04/2021, 10:49 AM [facility] Pager [phone]  

## 2021-07-04 NOTE — Op Note (Signed)
[facility] Patient Name: Joel Barry Procedure Date: 07/04/2021 MRN: [id number] Attending MD: Doristine Locks , MD Date of Birth: 09-May-1976 CSN: [id number] Age: 45 Admit Type: Inpatient Procedure:                Upper GI endoscopy Indications:              Heartburn, For therapy of esophageal reflux, For                            therapy of hiatal hernia                           45 yo male with long-standing history of GERD with                            a 4 cm hiatal hernia, presents for laparoscopic                            hiatal hernia repair and Transoral Incisionless                            Fundoplication (cTIF). Providers:                Doristine Locks, MD, Clearnce Sorrel, RN, Arlee Muslim Tech., Technician Referring MD:              Medicines:                General Anesthesia Complications:            No immediate complications. Estimated Blood Loss:     Estimated blood loss was minimal. Procedure:                Pre-Anesthesia Assessment:                           - Prior to the procedure, a History and Physical                            was performed, and patient medications and                            allergies were reviewed. The patient's tolerance of                            previous anesthesia was also reviewed. The risks                            and benefits of the procedure and the sedation                            options and risks were discussed with the patient.                            All questions were  answered, and informed consent                            was obtained. Prior Anticoagulants: The patient has                            taken no previous anticoagulant or antiplatelet                            agents. ASA Grade Assessment: II - A patient with                            mild systemic disease. After reviewing the risks                            and benefits, the patient was  deemed in                            satisfactory condition to undergo the procedure.                           After obtaining informed consent, the endoscope was                            passed under direct vision. Throughout the                            procedure, the patient's blood pressure, pulse, and                            oxygen saturations were monitored continuously. The                            GIF-H190 ([id number] Olympus gastroscope was                            introduced through the mouth, and advanced to the                            second part of duodenum. The upper GI endoscopy was                            accomplished without difficulty. The patient                            tolerated the procedure well. Scope In: Scope Out: Findings:      There were esophageal mucosal changes classified as Barrett's stage       C2-M3 per Prague criteria present in the lower third of the esophagus.       This was previously biopsied.      The upper third of the esophagus, middle third of the esophagus and       lower third of the esophagus were normal. In preparation for placement       of the larger caliber Esophyx  Z+ device, the decision was made to       perform empiric esophageal dilation. A guidewire was placed and the       scope was withdrawn. Dilation was performed with a Savary dilator with       no resistance at 18 mm. The dilation site was examined following       endoscope reinsertion and showed no bleeding, mucosal tear or       perforation. Estimated blood loss: none.      The gastroesophageal flap valve was visualized endoscopically and       classified as Hill Grade II (fold present, opens with respiration). The       previously-noted hiatal hernia has since been surgicall repaired. The       decision was made to perform transoral fundoplication with the EsophyX       Z+ system. The endoscope was withdrawn, placed through the plication       device,  reinserted into the patient and advanced past the level of the       GE junction at 45 cm from the incisors and into the stomach. Next, the       endoscope was advanced beyond the device and retroflexed. There was a       minimal gastric lip with an effaced valve. I first needed to create an       anterior lip which could then be used for fundoplication. The first       plication site was identified at the 1 o'clock position. With the device       in the proper position, the helical retractor was deployed and tissue       was pulled into the mold before it was closed. The device was rotated,       suction was applied using the invaginator, then the device was advanced       slightly and two H-shaped fasteners were placed. The device was reloaded       and the process repeated in order to deploy a total of ten fasteners at       the first site. The device was then rotated to the 11 o'clock position       after which the helical retractor was used to grasp additional tissue       within the mold before rotation and deployment of a total of eight       fasteners at the second site. To complete reconstruction of the valve,       additional fasteners were deployed at the following sites: four       fasteners at 5 o'clock, four fasteners at 7 o'clock positions. An       addiitional six fasteners were then placed at 1 o'clock position in       order to complete the wrap. In total, 32 contributed to create a valve       measuring 2.5 cm in length which involved 270 degrees of the       circumference upon retroflexed view. The EsophyX device and endoscope       were then removed. Relook endoscopy was performed prior to the       conclusion of the case to confirm the above findings. Estimated blood       loss was minimal.      The gastric body, incisura, gastric antrum and pylorus were normal.      The examined duodenum was normal. Impression:               -  Esophageal mucosal changes classified as                             Barrett's stage C2-M3 per Prague criteria.                           - Normal upper third of esophagus, middle third of                            esophagus and lower third of esophagus. Dilated.                           - Gastroesophageal flap valve classified as Hill                            Grade II (fold present, opens with respiration).                           - Normal gastric body, incisura, antrum and pylorus.                           - Normal examined duodenum.                           - EsophyX transoral fundoplication was performed.                           - No specimens collected. Moderate Sedation:      Not Applicable - Patient had care per Anesthesia. Recommendation:           -Admit to surgical ward for overnight observation                            with anticipated discharge tomorrow                           -Zofran 4 mg IV every 6 hours x24 hours, then prn                           -Reglan 10 mg every 6 hours x24 hours, then prn                           -Resume scopolamine patch x3 days (applied preop)                           -Protonix 40 mg p.o. BID x2 weeks, then 40 mg daily                            x2 weeks, then plan to maintain at least 20 mg                            daily due to history of Barrett's Esophagus.                           -  Decadron 8 mg every 6 hours times max 5 doses                           -Gas-X (simethicone) 4225 mg p.o. prn every 6 hours                            gas pain, abdominal discomfort                           -Tylenol 3 (APAP 120 mg/codeine 12 mg per 5 mL): 15                            mL's every 4 hours prn pain                           -Colace 100 mg p.o. twice daily if taking pain                            medications                           -Clear liquid diet okay overnight                           -Okay to ambulate with assist around the ward                           -Please do  not hesitate to contact me directly with                            any postoperative questions or concerns Procedure Code(s):        --- Professional ---                           [phone], Esophagogastroduodenoscopy, flexible,                            transoral; with insertion of guide wire followed by                            passage of dilator(s) through esophagus over guide                            wire Diagnosis Code(s):        --- Professional ---                           K22.70, Barrett's esophagus without dysplasia                           R12, Heartburn                           K21.9, Gastro-esophageal reflux disease without  esophagitis                           K44.9, Diaphragmatic hernia without obstruction or                            gangrene CPT copyright 2019 American Medical Association. All rights reserved. The codes documented in this report are preliminary and upon coder review may  be revised to meet current compliance requirements. Doristine Locks, MD 07/04/2021 5:22:01 PM Number of Addenda: 0

## 2021-07-04 NOTE — Transfer of Care (Signed)
Immediate Anesthesia Transfer of Care Note  Patient: Joel Barry  Procedure(s) Performed: LAPAROSCOPIC REPAIR OF HIATAL HERNIA WITH UPPER ENDOSCOPY (Abdomen) ESOPHAGOGASTRODUODENOSCOPY (EGD) (Esophagus) TRANSORAL INCISIONLESS FUNDOPLICATION (Esophagus)  Patient Location: PACU  Anesthesia Type:General  Level of Consciousness: awake, alert  and oriented  Airway & Oxygen Therapy: Patient Spontanous Breathing and Patient connected to face mask  Post-op Assessment: Report given to RN and Post -op Vital signs reviewed and stable  Post vital signs: Reviewed and stable  Last Vitals:  Vitals Value Taken Time  BP 125/71 07/04/21 1500  Temp    Pulse 73 07/04/21 1501  Resp 17 07/04/21 1501  SpO2 100 % 07/04/21 1501  Vitals shown include unvalidated device data.  Last Pain:  Vitals:   07/04/21 0913  TempSrc: Oral  PainSc:          Complications: No notable events documented.

## 2021-07-04 NOTE — Anesthesia Procedure Notes (Signed)
Procedure Name: Intubation Date/Time: 07/04/2021 11:29 AM Performed by: [place] Ann Bohne C, CRNA Pre-anesthesia Checklist: Patient identified, Emergency Drugs available, Suction available and Patient being monitored Patient Re-evaluated:Patient Re-evaluated prior to induction Oxygen Delivery Method: Circle system utilized Preoxygenation: Pre-oxygenation with 100% oxygen Induction Type: IV induction Ventilation: Mask ventilation without difficulty Laryngoscope Size: Mac and 4 Grade View: Grade I Tube type: Oral Tube size: 7.5 mm Number of attempts: 1 Airway Equipment and Method: Stylet and Oral airway Placement Confirmation: ETT inserted through vocal cords under direct vision, positive ETCO2 and breath sounds checked- equal and bilateral Secured at: 22 cm Tube secured with: Tape Dental Injury: Teeth and Oropharynx as per pre-operative assessment

## 2021-07-04 NOTE — H&P (Signed)
CC: here for surgery  Requesting provider: cirigliano  HPI: Joel Barry is an 45 y.o. male who is here for surgery for laparoscopic hiatal hernia with TIF.   He denies any medical changes since I saw him in clinic other than getting a stomach virus in May.  It prompted him to go to urgent care in his hometown.  He states that he had a CT scan which was unremarkable.  He did lose about 10 pounds because of the stomach bug.  Otherwise he is feeling well now.  He denies any chest pain, chest pressure, shortness of breath,  The patient is a 45 year old male who presents with gastroesophageal reflux disease. he is referred by Dr Barron Alvine for evaluation of GERD/hiatal hernia. He has longstanding heartburn and GERD.  He states that his heartburn is currently generally well controlled with his current regimen which is Protonix.  However he has to be very careful with the timing of when he eats.  If he eats too late or eats too much or eats tomato-based foods he will wake up in all the night with bile in his throat and cough for few hours and then he states that he will have some difficulty breathing for a few days.  Initially this was happening about every 6 months but not as frequent now.  He travels a lot for work.  He does sleep elevated along with a wedge pillow.  He has an occasional sticking sensation with solids.  He no longer has heartburn pain like he did 3 years ago.  He states that again his heartburn is actually well controlled with his current regimen he is concerned about long-term implications of heartburn medication as well as the nighttime issues that he occasionally has.  He has biopsy-proven Barrett's esophagus without any signs of dysplasia or cancer.  He underwent a laparoscopic appendectomy back in 2010 but bounced back with ileus and was in the hospital for about a week.  He states that he was put on a medication for pain and it caused bleeding.  He believes it was Toradol.   Otherwise no prior abdominal surgeries.  He does vape with low-dose nicotine.   GERD history: -Index symptoms: Heartburn, regurgitation, nocturnal cough -Exacerbating features: roasted red peppers, eating close to bedtime, coffee -Medications trialed: Dexilant, Protonix -Current medications: Protonix 40 mg/day -Complications: Barrett's Esophagus, hiatal hernia   GERD evaluation: -Last EGD: 08/2019 & 10/2020 - He also had a EGD in November 2021 with Dr. Barron Alvine.  Biopsies did confirm Barrett's without any signs of dysplasia, 4 cm hiatal hernia and a Hill grade 4 valve.  He had salmon-colored mucosa at 34 to 36 cm. -Barium esophagram: He had an upper GI in February of this year which I reviewed that showed small hiatal hernia and mild GERD -Esophageal Manometry: did not tolerate -pH/Impedance: None -Bravo: None   Endoscopic History: -egd- see above -EGD (Dr. 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 Danielle Dess, 09/16/2019): Medium sized HH, short segment Barrett's esophagus maximal extent 3 cm (path: Intestinal metaplasia without dysplasia), Patchy mild gastritis, normal duodenum     GERD-HRQL Questionnaire Score: 6/50 (on PPI).  Marked "dissatisfied" with current health condition.  High amounts of regurgitation on scoring (25/30 points)   Previously followed by [facility]. Barnetta Chapel, MD at [facility], last seen 11/07/2019.  Plan at that time  was for continue Dexilant 60 mg/day and Protonix 40 mg/day and follow-up in 1 year.  Past Medical History:  Diagnosis Date   Acid reflux    from referral source   Barrett's esophagus    Family history of adverse reaction to anesthesia    coma and cardiac arrest   GERD (gastroesophageal reflux disease)    from referral source   Hiatal hernia    per referral notes     Past Surgical History:  Procedure Laterality Date   APPENDECTOMY  07/2009   ESOPHAGOGASTRODUODENOSCOPY  06/09/2018   [facility]. Dr. Danielle Dess    Family History  Problem Relation Age of Onset   Melanoma Paternal Grandmother    Colon polyps Neg Hx    Colon cancer Neg Hx    Esophageal cancer Neg Hx    Rectal cancer Neg Hx    Stomach cancer Neg Hx     Social:  reports that he has quit smoking. He has never used smokeless tobacco. He reports current alcohol use of about 8.0 standard drinks of alcohol per week. He reports that he does not use drugs.  Allergies: No Known Allergies  Medications: I have reviewed the patient's current medications.   ROS - all of the below systems have been reviewed with the patient and positives are indicated with bold text General: chills, fever or night sweats Eyes: blurry vision or double vision ENT: epistaxis or sore throat Allergy/Immunology: itchy/watery eyes or nasal congestion Hematologic/Lymphatic: bleeding problems, blood clots or swollen lymph nodes Endocrine: temperature intolerance or unexpected weight changes Breast: new or changing breast lumps or nipple discharge Resp: cough, shortness of breath, or wheezing CV: chest pain or dyspnea on exertion GI: as per HPI GU: dysuria, trouble voiding, or hematuria MSK: joint pain or joint stiffness Neuro: TIA or stroke symptoms Derm: pruritus and skin lesion changes Psych: anxiety and depression  PE Blood pressure 125/85, pulse (!) 57, temperature 98.2 F (36.8 C), temperature source Oral, resp. rate 18, weight 81.6 kg, SpO2 100 %. Constitutional: NAD; conversant; no deformities Eyes: Moist conjunctiva; no lid lag; anicteric; PERRL Neck: Trachea midline; no thyromegaly Lungs: Normal respiratory effort; no tactile fremitus CV: RRR; no palpable thrills; no pitting edema GI: Abd soft; no palpable hepatosplenomegaly MSK: Normal gait; no clubbing/cyanosis Psychiatric: Appropriate affect; alert  and oriented x3 Lymphatic: No palpable cervical or axillary lymphadenopathy Skin:no rash/edema  Results for orders placed or performed during the hospital encounter of 07/04/21 (from the past 48 hour(s))  ABO/Rh     Status: None   Collection Time: 07/04/21  9:15 AM  Result Value Ref Range   ABO/RH(D)      [facility] Performed at [facility], [address], [place], [place] [address]     No results found.  Imaging: Personally reviewed  A/P: LAURIS SERVISS is an 45 y.o. male with  GERD/hiatal hernia  2 OR for laparoscopic hiatal hernia repair with concomitant TIF procedure IV antibiotic Preoperative enhanced recovery med Preoperative subcu heparin All questions asked and answered  Mary Sella. Andrey Campanile, MD, FACS General, Bariatric, & Minimally Invasive Surgery [facility], [place]

## 2021-07-05 DIAGNOSIS — K2270 Barrett's esophagus without dysplasia: Secondary | ICD-10-CM

## 2021-07-05 DIAGNOSIS — K449 Diaphragmatic hernia without obstruction or gangrene: Secondary | ICD-10-CM | POA: Diagnosis not present

## 2021-07-05 MED ORDER — OXYCODONE HCL 5 MG PO TABS: 5.0000 mg | ORAL_TABLET | Freq: Four times a day (QID) | ORAL | 0 refills | Status: AC | PRN

## 2021-07-05 MED ORDER — METOCLOPRAMIDE HCL 10 MG PO TABS: 10.0000 mg | ORAL_TABLET | Freq: Four times a day (QID) | ORAL | 1 refills | Status: DC | PRN

## 2021-07-05 MED ORDER — ONDANSETRON HCL 4 MG PO TABS: 4.0000 mg | ORAL_TABLET | Freq: Three times a day (TID) | ORAL | 1 refills | Status: DC | PRN

## 2021-07-05 MED ORDER — PANTOPRAZOLE SODIUM 40 MG PO TBEC: 40.0000 mg | DELAYED_RELEASE_TABLET | Freq: Two times a day (BID) | ORAL | 1 refills | Status: DC

## 2021-07-05 MED ORDER — SIMETHICONE 80 MG PO CHEW: 80.0000 mg | CHEWABLE_TABLET | Freq: Four times a day (QID) | ORAL | 0 refills | Status: AC | PRN

## 2021-07-05 MED ORDER — ACETAMINOPHEN 325 MG PO TABS
650.0000 mg | ORAL_TABLET | ORAL | 1 refills | Status: DC | PRN
Start: 2021-07-05 — End: 2021-07-17

## 2021-07-05 MED ORDER — ACETAMINOPHEN 325 MG PO TABS
650.0000 mg | ORAL_TABLET | ORAL | Status: DC | PRN
  Administered 2021-07-05: 650 mg via ORAL
  Filled 2021-07-05: qty 2

## 2021-07-05 NOTE — Discharge Summary (Signed)
[facility] DISCHARGE SUMMARY  Date of admission: 07/04/2021 Date of discharge: 07/06/2019 Attending: Dr. Barron Alvine Primary Care provider: Unassigned Discharge diagnosis: GERD, hiatal hernia Consultations: None Procedures performed: EGD with Transoral Incisionless Fundoplication (TIF) History: See admission H&P Exam: See below  Hospital course: 1) GERD 2) Hiatal hernia Joel Barry is a 45 y.o. male s/p laparoscopic hiatal hernia EGD with Transoral Incisionless Fundoplication (TIF) completed on 07/04/2021 without complications. Did well overnight, without any acute events.  Tolerating liquid diet and oral medications without issue.  Discharge vital signs: See below Discharge labs: None Disposition: Home in stable condition Follow-up appointments: See below  Day of Discharge:  Did well overnight, without any acute events.  Tolerating liquid diet and oral medications without issue.  Objective: Vital signs in last 24 hours: Temp:  [96 F (35.6 C)-98.7 F (37.1 C)] 98.1 F (36.7 C) (07/15 0542) Pulse Rate:  [51-87] 52 (07/15 0542) Resp:  [12-21] 18 (07/15 0542) BP: (110-140)/(64-85) 111/68 (07/15 0542) SpO2:  [98 %-100 %] 100 % (07/15 0542) Weight:  [81.6 kg] 81.6 kg (07/14 1615) Last BM Date: 07/04/21 General: NAD Abdomen:  Mild expected post operative TTP in epigastrium without rebound or guarding, no peritoneal signs. Otherwise, soft, ND.  No sternal tenderness or crepitus Ext:  No c/c/e    Intake/Output from previous day: 07/14 0701 - 07/15 0700 In: 1000 [I.V.:1000] Out: 350 [Urine:350] Intake/Output this shift: No intake/output data recorded.   Lab Results: Recent Labs    07/02/21 1130  WBC 4.2  HGB 13.4  PLT 181  MCV 88.1   BMET Recent Labs    07/02/21 1130  NA 140  K 4.0  CL 106  CO2 28  GLUCOSE 88  BUN 12  CREATININE 1.15  CALCIUM 9.6   LFT Recent Labs    07/02/21 1130  PROT 7.8  ALBUMIN 4.8  AST 24  ALT 17  ALKPHOS  42  BILITOT 1.0   PT/INR No results for input(s): INR in the last 72 hours.    Imaging/Other results: No results found.    Assessment and Plan:  Joel Barry is a 45 y.o. male s/p laparoscopic hiatal hernia repair and EGD with Transoral Incisionless Fundoplication (TIF) completed yesterday with no events on overnight observation. Will plan on d/c to home today with the following plan:   - Protonix 40 mg PO BID for 2 weeks, then 40 mg daily for 2 weeks, then 20 mg daily for continued treatment of underlying Barrett's Esophagus - Discharge with Zofran 4 mg PO prn Q6 hours for nausea  - Discharge with Reglan 10 mg PO prn Q6 hours for nausea  - Discharge with Simethicone 80 mg PO prn Q6 hours for bloating/abdominal discomfort - OTC Tylenol as needed for mild postoperative pain - Discharge with Percocet for breakthrough pain not controlled by Tylenol  Diet:  - 2 weeks of liquid soft foods followed by 4 weeks slowly progressive diet back to regular  - Provided with handout for post operative diet plan   Post Op Activity:  - Week 1: encourage short distance walking, minimal physical activity, no lifting >5 lbs  - Week 2: Slow climbing stairs, no intense exercise, no lifting >5 lbs  - Week 3-6: No intense exercise, may lift up to 25 lbs  - Week 7: Resume normal activity   - To follow-up with me on 07/17/2021 at 11:20 AM in the [facility] at [facility] - To follow-up with Dr. Andrey Campanile at [place]  [facility] on 07/31/2021 at 8:30 AM    [name], DO  07/05/2021, 7:36 AM [facility] Pager [phone]

## 2021-07-05 NOTE — Anesthesia Postprocedure Evaluation (Signed)
Anesthesia Post Note  Patient: Joel Barry  Procedure(s) Performed: LAPAROSCOPIC REPAIR OF HIATAL HERNIA WITH UPPER ENDOSCOPY (Abdomen) ESOPHAGOGASTRODUODENOSCOPY (EGD) (Esophagus) TRANSORAL INCISIONLESS FUNDOPLICATION (Esophagus)     Patient location during evaluation: PACU Anesthesia Type: General Level of consciousness: awake and alert Pain management: pain level controlled Vital Signs Assessment: post-procedure vital signs reviewed and stable Respiratory status: spontaneous breathing, nonlabored ventilation, respiratory function stable and patient connected to nasal cannula oxygen Cardiovascular status: blood pressure returned to baseline and stable Postop Assessment: no apparent nausea or vomiting Anesthetic complications: no   No notable events documented.  Last Vitals:  Vitals:   07/05/21 0223 07/05/21 0542  BP: 110/67 111/68  Pulse: (!) 52 (!) 52  Resp: 18 18  Temp: 37.1 C 36.7 C  SpO2: 98% 100%    Last Pain:  Vitals:   07/05/21 0615  TempSrc:   PainSc: 5                  Trevor Iha

## 2021-07-05 NOTE — Progress Notes (Signed)
Discharge instructions given to patient and all questions were answered.  

## 2021-07-08 ENCOUNTER — Encounter (HOSPITAL_COMMUNITY): Payer: Self-pay | Admitting: General Surgery

## 2021-07-12 ENCOUNTER — Telehealth: Payer: Self-pay | Admitting: Gastroenterology

## 2021-07-12 NOTE — Telephone Encounter (Signed)
Patient is off of his pain meds now.  He is asking if he can slowly introduce alcohol?  He is asking if he can have a cocktail?  When is it safe to add caffeine back in his diet?

## 2021-07-12 NOTE — Telephone Encounter (Signed)
Pt calling wants to know what he can eat/drink post TIF procedure. Plz advise thank you

## 2021-07-12 NOTE — Telephone Encounter (Signed)
Patient notified

## 2021-07-12 NOTE — Telephone Encounter (Signed)
Do you have a particular diet post TIF he will follow ?

## 2021-07-12 NOTE — Telephone Encounter (Signed)
I am actually okay with patient's adding coffee/caffeine back into their diet within the first week or so.  Just avoid caffeine in the form of carbonated beverages for now.  Similarly, alcohol (in moderation) is okay to slowly introduce back as well, but again would avoid carbonated beverages such as champagne or beer.

## 2021-07-17 ENCOUNTER — Encounter: Payer: Self-pay | Admitting: Gastroenterology

## 2021-07-17 ENCOUNTER — Ambulatory Visit (INDEPENDENT_AMBULATORY_CARE_PROVIDER_SITE_OTHER): Payer: Managed Care, Other (non HMO) | Admitting: Gastroenterology

## 2021-07-17 ENCOUNTER — Other Ambulatory Visit: Payer: Self-pay

## 2021-07-17 VITALS — BP 118/70 | HR 85 | Ht 72.0 in | Wt 176.0 lb

## 2021-07-17 DIAGNOSIS — K219 Gastro-esophageal reflux disease without esophagitis: Secondary | ICD-10-CM

## 2021-07-17 DIAGNOSIS — Z9889 Other specified postprocedural states: Secondary | ICD-10-CM | POA: Diagnosis not present

## 2021-07-17 DIAGNOSIS — K2270 Barrett's esophagus without dysplasia: Secondary | ICD-10-CM | POA: Diagnosis not present

## 2021-07-17 NOTE — Patient Instructions (Signed)
If you are age 45 or older, your body mass index should be between 23-30. Your Body mass index is 23.87 kg/m. If this is out of the aforementioned range listed, please consider follow up with your Primary Care Provider.  If you are age 61 or younger, your body mass index should be between 19-25. Your Body mass index is 23.87 kg/m. If this is out of the aformentioned range listed, please consider follow up with your Primary Care Provider.   __________________________________________________________  The [facility] providers would like to encourage you to use [facility] to communicate with providers for non-urgent requests or questions.  Due to long hold times on the telephone, sending your provider a message by [facility] may be a faster and more efficient way to get a response.  Please allow 48 business hours for a response.  Please remember that this is for non-urgent requests.    Follow up in 3 months. Please call [phone] to schedule.  Due to recent changes in healthcare laws, you may see the results of your imaging and laboratory studies on MyChart before your provider has had a chance to review them.  We understand that in some cases there may be results that are confusing or concerning to you. Not all laboratory results come back in the same time frame and the provider may be waiting for multiple results in order to interpret others.  Please give [place] 48 hours in order for your provider to thoroughly review all the results before contacting the office for clarification of your results.   Thank you for choosing me and [facility].  Vito Cirigliano, D.O.

## 2021-07-17 NOTE — Progress Notes (Signed)
Chief Complaint:    Post operative follow-up after cTIF  GI History: 45 y.o. male with a long-standing hx of GERD since late 20's. +nocturnal sxs.    GERD history: -Index symptoms: Heartburn, regurgitation, nocturnal cough -Exacerbating features: roasted red peppers, eating close to bedtime, coffee -Medications trialed: Dexilant, Protonix -Complications: SSND Barrett's Esophagus, hiatal hernia   GERD evaluation: -Last EGD: 08/2019 -Barium esophagram: 01/2021: Mild gastroesophageal reflux, sliding HH, normal motility -Esophageal Manometry: None (unable to tolerate) -pH/Impedance: None (unable to tolerate) -Bravo: None   Endoscopic History: -EGD (Dr. 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 Barron Alvine, 12/04/2020): 4 cm HH, C2 M3 nondysplastic Barrett's Esophagus -Lap HHR and TIF (cTIF) completed 07/04/2021  HPI:     Patient is a 45 y.o. male presenting to the [facility] for post operative follow-up after cTIF on 07/04/2021.   He states he is overall doing well.  Will still have intermittent discomfort in lower sternal border/epigastrium, but slowly improving.  Tolerating post operative diet without major issues.  Still taking Protonix 40 mg bid.  Has f/u with Dr. Andrey Campanile in 2 weeks.   Review of systems:     No chest pain, no SOB, no fevers, no urinary sx   Past Medical History:  Diagnosis Date   Acid reflux    from referral source   Barrett's esophagus    Family history of adverse reaction to anesthesia    coma and cardiac arrest   GERD (gastroesophageal reflux disease)    from referral source   Hiatal  hernia    per referral notes    Patient's surgical history, family medical history, social history, medications and allergies were all reviewed in Epic    Current Outpatient Medications  Medication Sig Dispense Refill   finasteride (PROPECIA) 1 MG tablet Take 1 mg by mouth daily.     pantoprazole (PROTONIX) 40 MG tablet Take 1 tablet (40 mg total) by mouth 2 (two) times daily. 60 tablet 1   simethicone (MYLICON) 80 MG chewable tablet Chew 1 tablet (80 mg total) by mouth every 6 (six) hours as needed for flatulence (gas, bloating, abdominal discomfort). 30 tablet 0   No current facility-administered medications for this visit.    Physical Exam:     BP 118/70   Pulse 85   Ht 6' (1.829 m)   Wt 176 lb (79.8 kg)   SpO2 98%   BMI 23.87 kg/m   GENERAL:  Pleasant male in NAD PSYCH: : Cooperative, normal affect EENT:  conjunctiva pink, mucous membranes moist, neck supple without masses CARDIAC:  RRR, no murmur heard, no peripheral edema PULM: Normal respiratory effort, lungs CTA bilaterally, no wheezing ABDOMEN: Well-healed surgical incision sites with minimal TTP around incisions.  Nondistended, soft.  No peritoneal signs. No obvious masses, no hepatomegaly,  normal bowel sounds SKIN:  turgor, no lesions seen Musculoskeletal:  Normal muscle tone, normal strength NEURO: Alert and oriented x 3, no focal neurologic deficits   IMPRESSION and PLAN:    1) History of GERD now s/p cTIF 2) History of hiatal hernia now s/p lap HHR 3) Nondysplastic short segment Barrett's Esophagus  - Start to de-escalate acid suppression therapy.  Can reduce Protonix to 40 mg/day  x2 weeks, and if symptoms still well controlled, reduce to 20 mg/day - Given history of short segment NDBE, plan for now is to continue with low-dose PPI in the long-term with consideration for repeat upper endoscopy in 1 year.  If complete regression of Barrett's Esophagus, can consider stopping acid suppression therapy  altogether.  If continued Barrett's, recommend continued surveillance protocol - Will send these records to his primary Gastroenterologist, Dr. Danielle Dess.  Following 9-month follow-up appointment, if doing well, plan to release his ongoing GI care to Dr. Danielle Dess for coordination of ongoing surveillance as above - Follow-up with Dr. Andrey Campanile as scheduled - Continue slowly advancing diet and activity per postoperative protocol - RTC in 3 months or sooner as needed          [name] ,DO, FACG 07/17/2021, 11:46 AM

## 2021-08-14 ENCOUNTER — Other Ambulatory Visit: Payer: Self-pay | Admitting: Gastroenterology

## 2021-11-22 ENCOUNTER — Telehealth: Payer: Self-pay | Admitting: Gastroenterology

## 2021-11-22 NOTE — Telephone Encounter (Signed)
Patient has moved to the [place] area and will be seeing Dr. Danielle Dess at [facility] in January.  Patient is requesting that his records for his TIF procedure be faxed to [phone].  Thank you.

## 2021-11-28 NOTE — Telephone Encounter (Signed)
Records faxed to DR Danielle Dess in [place] per the patients request. Pt is moving and will see this GI provider in Jan 2023.

## 2021-12-18 ENCOUNTER — Other Ambulatory Visit: Payer: Self-pay | Admitting: Gastroenterology

## 2022-05-18 IMAGING — RF DG ESOPHAGUS
8 of 9 series · 14 of 18 positions shown · non-contrast
Comparison: None.

CLINICAL DATA: Hiatal hernia, reflux disease

EXAM:
ESOPHOGRAM / BARIUM SWALLOW / BARIUM TABLET STUDY
TECHNIQUE: Combined double contrast and single contrast examination performed
using effervescent crystals, thick barium liquid, and thin barium
liquid. The patient was observed with fluoroscopy swallowing a 13 mm
barium sulphate tablet.
FLUOROSCOPY TIME:  Fluoroscopy Time:  0.4 minute
Radiation Exposure Index (if provided by the fluoroscopic device):
2.1 mGy
Number of Acquired Spot Images: 0

[Series 1: cp_standard · 0.25mm/px · 3 of 21 frames shown (1 of 8)]
[frame 1/21]
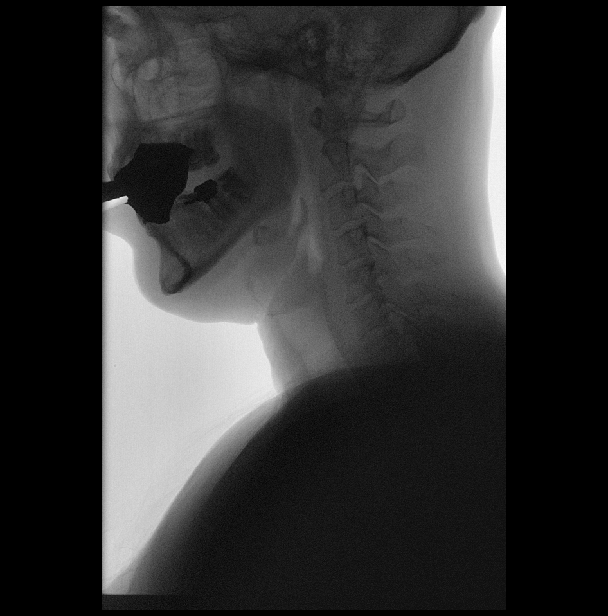
[frame 4/21]
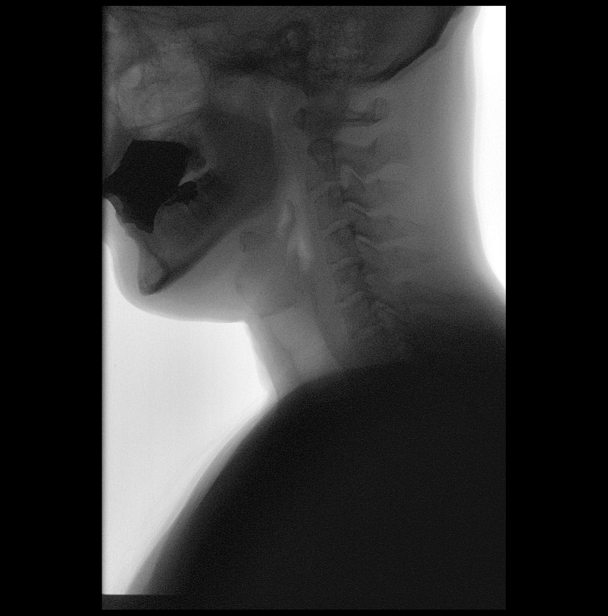
[frame 18/21]
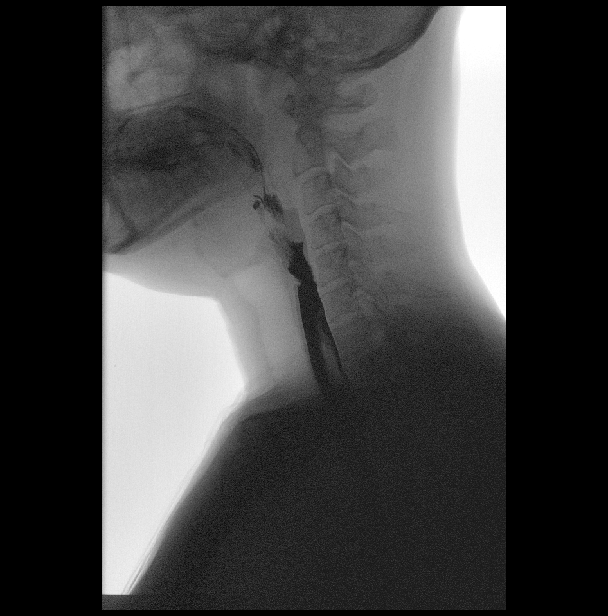

[Series 2: cp_standard · 0.25mm/px · 3 of 9 frames shown (2 of 8)]
[frame 2/9]
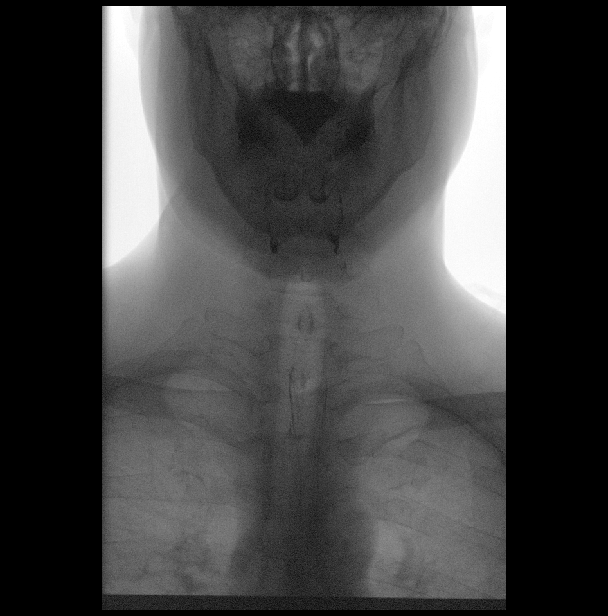
[frame 4/9]
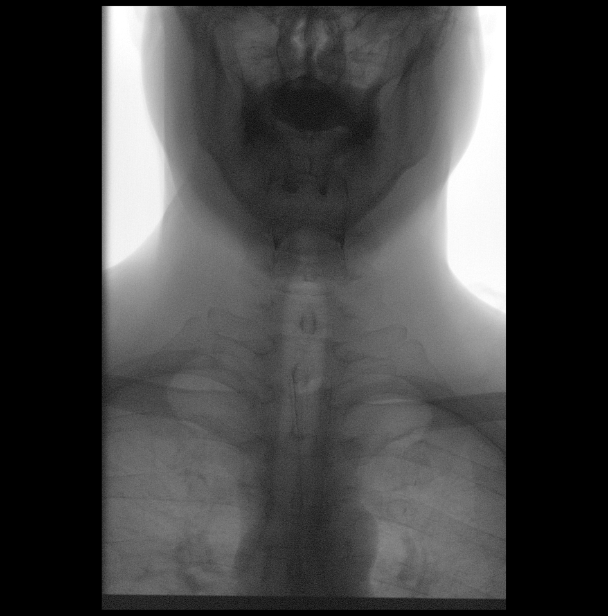
[frame 8/9]
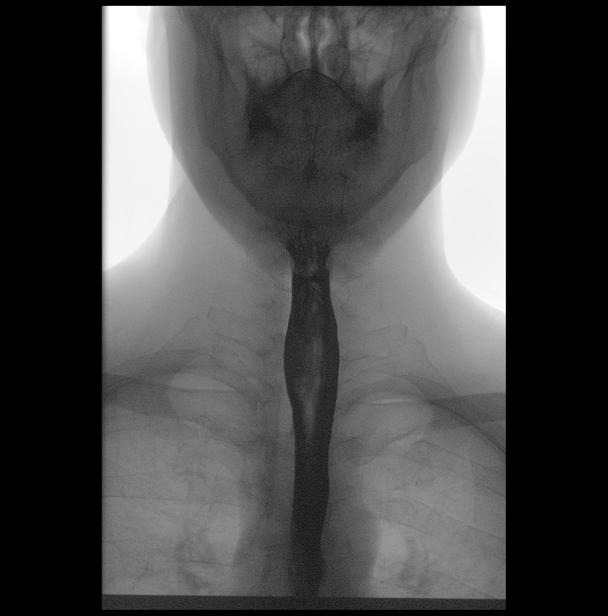

[Series 3: cp_standard · 0.25mm/px · 1 of 1 slices shown (3 of 8)]
[im 1/1]
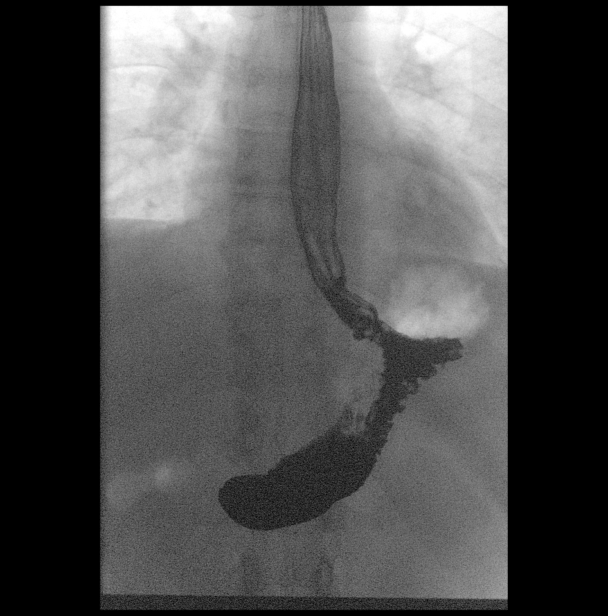

[Series 4: cp_standard · 0.25mm/px · 3 of 45 frames shown (4 of 8)]
[frame 3/45]
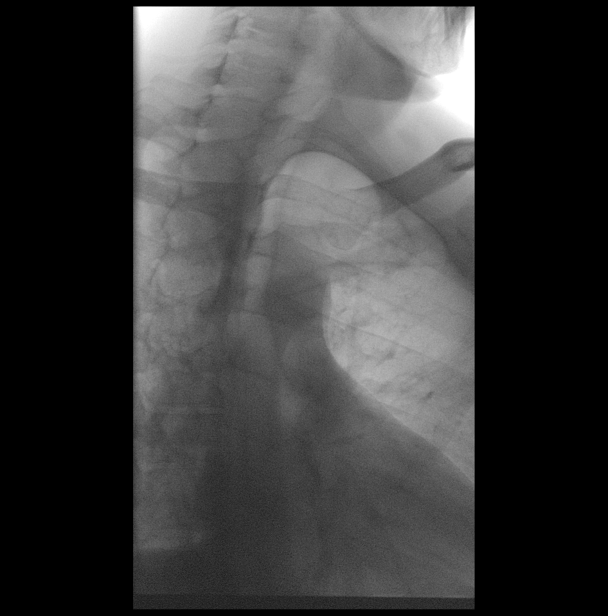
[frame 7/45]
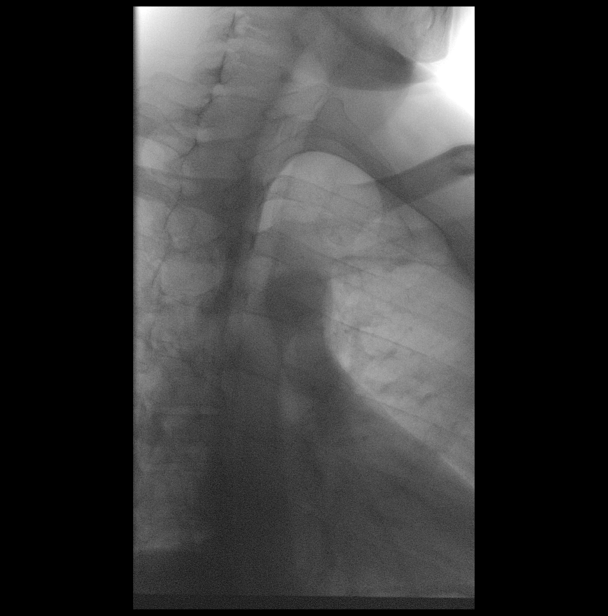
[frame 39/45]
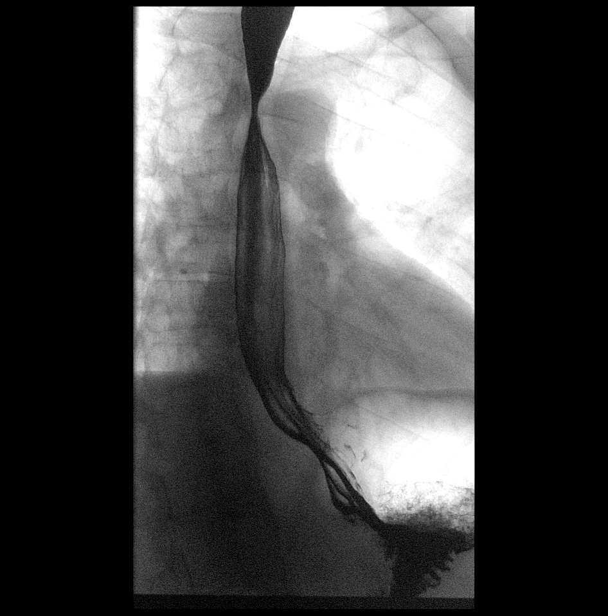

[Series 5: cp_standard · 0.28mm/px · 1 of 1 slices shown (5 of 8)]
[im 1/1]
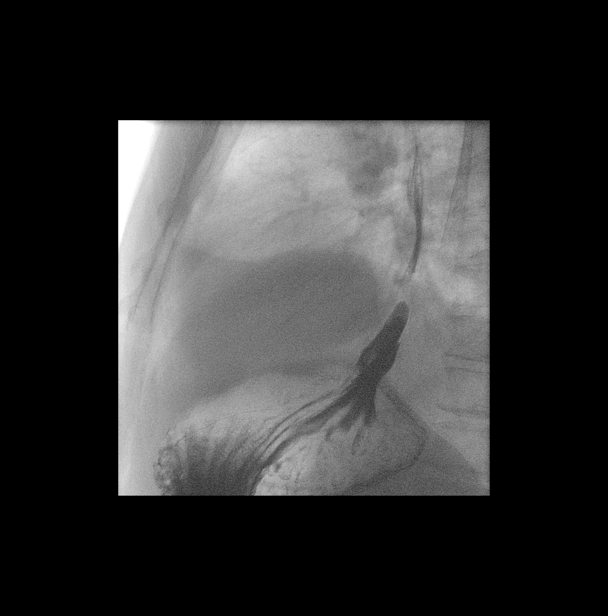

[Series 6: cp_standard · 0.27mm/px · 1 of 1 slices shown (6 of 8)]
[im 1/1]
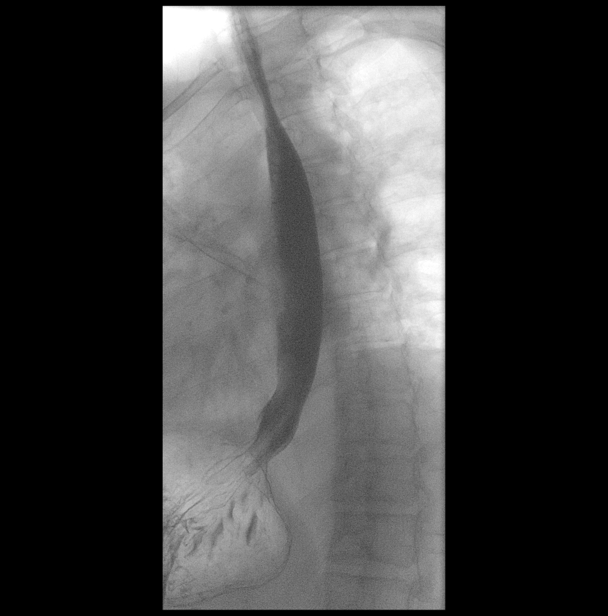

[Series 8: cp_standard · 0.28mm/px · 1 of 1 slices shown (7 of 8)]
[im 1/1]
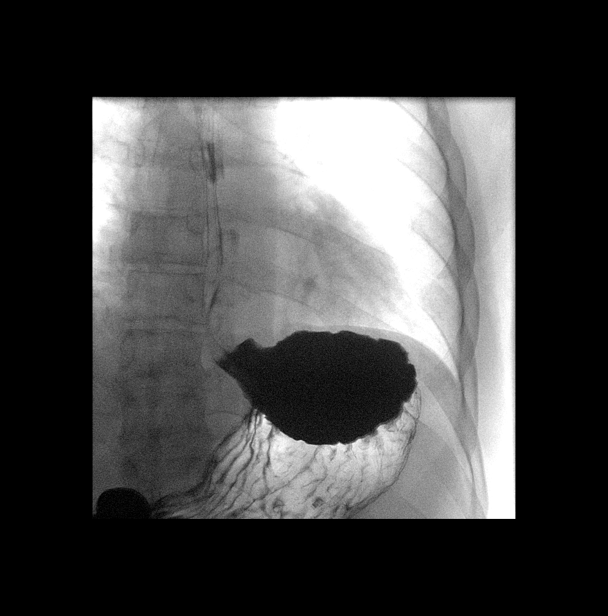

[Series 9: cp_standard · 0.28mm/px · 1 of 1 slices shown (8 of 8)]
[im 1/1]
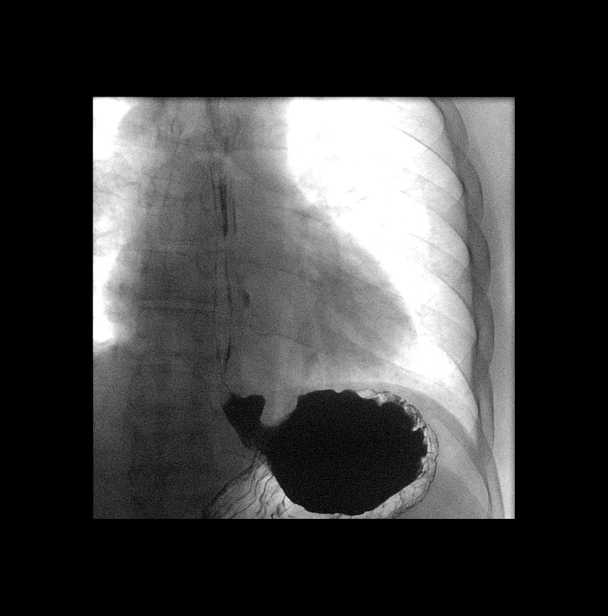

[14 of 18 positions shown; findings below may reference images not displayed]

FINDINGS: Normal pharyngeal anatomy and motility. Contrast flowed freely
through the esophagus without evidence of a stricture or mass.
Normal esophageal mucosa without evidence of irregularity or
ulceration. Esophageal motility was normal. Mild gastroesophageal
reflux. Small transient hiatal hernia.

At the end of the examination a 13 mm barium tablet was administered
which transited through the esophagus and esophagogastric junction
without delay.
IMPRESSION: 1. Mild gastroesophageal reflux.
2. Small transient hiatal hernia.
3. Otherwise normal barium swallow.
